# Patient Record
Sex: Female | Born: 1975
Health system: Southern US, Community
[De-identification: ages and names within clinical notes are randomized; demographics above are authoritative.]

## PROBLEM LIST (undated history)

## (undated) DIAGNOSIS — F419 Anxiety disorder, unspecified: Secondary | ICD-10-CM

## (undated) DIAGNOSIS — F319 Bipolar disorder, unspecified: Secondary | ICD-10-CM

## (undated) DIAGNOSIS — F259 Schizoaffective disorder, unspecified: Secondary | ICD-10-CM

## (undated) DIAGNOSIS — F988 Other specified behavioral and emotional disorders with onset usually occurring in childhood and adolescence: Secondary | ICD-10-CM

## (undated) DIAGNOSIS — K589 Irritable bowel syndrome without diarrhea: Secondary | ICD-10-CM

## (undated) HISTORY — DX: Irritable bowel syndrome, unspecified: K58.9

## (undated) HISTORY — DX: Other specified behavioral and emotional disorders with onset usually occurring in childhood and adolescence: F98.8

## (undated) HISTORY — PX: TRACHEOSTOMY: SUR1362

## (undated) HISTORY — PX: CHOLECYSTECTOMY: SHX55

## (undated) HISTORY — DX: Schizoaffective disorder, unspecified: F25.9

## (undated) HISTORY — PX: ENDOMETRIAL ABLATION: SHX621

---

## 1995-05-01 HISTORY — PX: LAPAROSCOPY: SHX197

## 1999-09-23 ENCOUNTER — Inpatient Hospital Stay (HOSPITAL_COMMUNITY): Admission: EM | Admit: 1999-09-23 | Discharge: 1999-09-26 | Payer: Self-pay | Admitting: Psychiatry

## 1999-09-28 ENCOUNTER — Ambulatory Visit (HOSPITAL_COMMUNITY): Admission: RE | Admit: 1999-09-28 | Discharge: 1999-09-28 | Payer: Self-pay | Admitting: Psychiatry

## 1999-10-05 ENCOUNTER — Ambulatory Visit (HOSPITAL_COMMUNITY): Admission: RE | Admit: 1999-10-05 | Discharge: 1999-10-05 | Payer: Self-pay | Admitting: Psychiatry

## 1999-10-12 ENCOUNTER — Ambulatory Visit (HOSPITAL_COMMUNITY): Admission: RE | Admit: 1999-10-12 | Discharge: 1999-10-12 | Payer: Self-pay | Admitting: Psychiatry

## 1999-10-19 ENCOUNTER — Ambulatory Visit (HOSPITAL_COMMUNITY): Admission: RE | Admit: 1999-10-19 | Discharge: 1999-10-19 | Payer: Self-pay | Admitting: Psychiatry

## 1999-10-26 ENCOUNTER — Ambulatory Visit (HOSPITAL_COMMUNITY): Admission: RE | Admit: 1999-10-26 | Discharge: 1999-10-26 | Payer: Self-pay | Admitting: Psychiatry

## 1999-11-09 ENCOUNTER — Ambulatory Visit (HOSPITAL_COMMUNITY): Admission: RE | Admit: 1999-11-09 | Discharge: 1999-11-09 | Payer: Self-pay | Admitting: Psychiatry

## 1999-11-16 ENCOUNTER — Ambulatory Visit (HOSPITAL_COMMUNITY): Admission: RE | Admit: 1999-11-16 | Discharge: 1999-11-16 | Payer: Self-pay | Admitting: Psychiatry

## 1999-11-23 ENCOUNTER — Ambulatory Visit (HOSPITAL_COMMUNITY): Admission: RE | Admit: 1999-11-23 | Discharge: 1999-11-23 | Payer: Self-pay | Admitting: Psychiatry

## 1999-11-30 ENCOUNTER — Ambulatory Visit (HOSPITAL_COMMUNITY): Admission: RE | Admit: 1999-11-30 | Discharge: 1999-11-30 | Payer: Self-pay | Admitting: Psychiatry

## 1999-12-06 ENCOUNTER — Ambulatory Visit (HOSPITAL_COMMUNITY): Admission: RE | Admit: 1999-12-06 | Discharge: 1999-12-06 | Payer: Self-pay | Admitting: Psychiatry

## 1999-12-14 ENCOUNTER — Ambulatory Visit (HOSPITAL_COMMUNITY): Admission: RE | Admit: 1999-12-14 | Discharge: 1999-12-14 | Payer: Self-pay | Admitting: Psychiatry

## 1999-12-21 ENCOUNTER — Ambulatory Visit (HOSPITAL_COMMUNITY): Admission: RE | Admit: 1999-12-21 | Discharge: 1999-12-21 | Payer: Self-pay | Admitting: Psychiatry

## 1999-12-27 ENCOUNTER — Ambulatory Visit (HOSPITAL_COMMUNITY): Admission: RE | Admit: 1999-12-27 | Discharge: 1999-12-27 | Payer: Self-pay | Admitting: Psychiatry

## 2000-01-18 ENCOUNTER — Ambulatory Visit (HOSPITAL_COMMUNITY): Admission: RE | Admit: 2000-01-18 | Discharge: 2000-01-18 | Payer: Self-pay | Admitting: Psychiatry

## 2000-01-25 ENCOUNTER — Ambulatory Visit (HOSPITAL_COMMUNITY): Admission: RE | Admit: 2000-01-25 | Discharge: 2000-01-25 | Payer: Self-pay | Admitting: Psychiatry

## 2000-02-01 ENCOUNTER — Ambulatory Visit (HOSPITAL_COMMUNITY): Admission: RE | Admit: 2000-02-01 | Discharge: 2000-02-01 | Payer: Self-pay | Admitting: Psychiatry

## 2000-02-15 ENCOUNTER — Ambulatory Visit (HOSPITAL_COMMUNITY): Admission: RE | Admit: 2000-02-15 | Discharge: 2000-02-15 | Payer: Self-pay | Admitting: Psychiatry

## 2000-02-27 ENCOUNTER — Ambulatory Visit (HOSPITAL_COMMUNITY): Admission: RE | Admit: 2000-02-27 | Discharge: 2000-02-27 | Payer: Self-pay | Admitting: Psychiatry

## 2000-03-07 ENCOUNTER — Ambulatory Visit (HOSPITAL_COMMUNITY): Admission: RE | Admit: 2000-03-07 | Discharge: 2000-03-07 | Payer: Self-pay | Admitting: Psychiatry

## 2000-03-12 ENCOUNTER — Ambulatory Visit (HOSPITAL_COMMUNITY): Admission: RE | Admit: 2000-03-12 | Discharge: 2000-03-12 | Payer: Self-pay | Admitting: Psychiatry

## 2000-03-19 ENCOUNTER — Ambulatory Visit (HOSPITAL_COMMUNITY): Admission: RE | Admit: 2000-03-19 | Discharge: 2000-03-19 | Payer: Self-pay | Admitting: Psychiatry

## 2000-03-26 ENCOUNTER — Ambulatory Visit (HOSPITAL_COMMUNITY): Admission: RE | Admit: 2000-03-26 | Discharge: 2000-03-26 | Payer: Self-pay | Admitting: Psychiatry

## 2000-04-02 ENCOUNTER — Ambulatory Visit (HOSPITAL_COMMUNITY): Admission: RE | Admit: 2000-04-02 | Discharge: 2000-04-02 | Payer: Self-pay | Admitting: Psychiatry

## 2000-04-08 ENCOUNTER — Ambulatory Visit (HOSPITAL_COMMUNITY): Admission: RE | Admit: 2000-04-08 | Discharge: 2000-04-08 | Payer: Self-pay | Admitting: Psychiatry

## 2000-04-18 ENCOUNTER — Ambulatory Visit (HOSPITAL_COMMUNITY): Admission: RE | Admit: 2000-04-18 | Discharge: 2000-04-18 | Payer: Self-pay | Admitting: Psychiatry

## 2000-05-07 ENCOUNTER — Ambulatory Visit (HOSPITAL_COMMUNITY): Admission: RE | Admit: 2000-05-07 | Discharge: 2000-05-07 | Payer: Self-pay | Admitting: Psychiatry

## 2000-05-14 ENCOUNTER — Ambulatory Visit (HOSPITAL_COMMUNITY): Admission: RE | Admit: 2000-05-14 | Discharge: 2000-05-14 | Payer: Self-pay | Admitting: Psychiatry

## 2000-05-21 ENCOUNTER — Ambulatory Visit (HOSPITAL_COMMUNITY): Admission: RE | Admit: 2000-05-21 | Discharge: 2000-05-21 | Payer: Self-pay | Admitting: Psychiatry

## 2000-05-28 ENCOUNTER — Ambulatory Visit (HOSPITAL_COMMUNITY): Admission: RE | Admit: 2000-05-28 | Discharge: 2000-05-28 | Payer: Self-pay | Admitting: Psychiatry

## 2000-07-19 ENCOUNTER — Inpatient Hospital Stay (HOSPITAL_COMMUNITY): Admission: AD | Admit: 2000-07-19 | Discharge: 2000-07-24 | Payer: Self-pay | Admitting: *Deleted

## 2000-07-21 ENCOUNTER — Emergency Department (HOSPITAL_COMMUNITY): Admission: EM | Admit: 2000-07-21 | Discharge: 2000-07-21 | Payer: Self-pay | Admitting: Emergency Medicine

## 2000-07-25 ENCOUNTER — Other Ambulatory Visit (HOSPITAL_COMMUNITY): Admission: RE | Admit: 2000-07-25 | Discharge: 2000-07-26 | Payer: Self-pay | Admitting: Psychiatry

## 2000-07-28 ENCOUNTER — Inpatient Hospital Stay (HOSPITAL_COMMUNITY): Admission: EM | Admit: 2000-07-28 | Discharge: 2000-07-30 | Payer: Self-pay | Admitting: *Deleted

## 2000-07-31 ENCOUNTER — Other Ambulatory Visit (HOSPITAL_COMMUNITY): Admission: RE | Admit: 2000-07-31 | Discharge: 2000-08-07 | Payer: Self-pay | Admitting: Psychiatry

## 2001-03-06 ENCOUNTER — Ambulatory Visit (HOSPITAL_COMMUNITY): Admission: RE | Admit: 2001-03-06 | Discharge: 2001-03-06 | Payer: Self-pay | Admitting: Pulmonary Disease

## 2001-03-15 ENCOUNTER — Encounter: Payer: Self-pay | Admitting: Emergency Medicine

## 2001-03-15 ENCOUNTER — Emergency Department (HOSPITAL_COMMUNITY): Admission: EM | Admit: 2001-03-15 | Discharge: 2001-03-15 | Payer: Self-pay | Admitting: Emergency Medicine

## 2001-03-20 ENCOUNTER — Encounter (HOSPITAL_COMMUNITY): Admission: RE | Admit: 2001-03-20 | Discharge: 2001-04-19 | Payer: Self-pay | Admitting: *Deleted

## 2001-04-25 ENCOUNTER — Encounter (HOSPITAL_COMMUNITY): Admission: RE | Admit: 2001-04-25 | Discharge: 2001-05-25 | Payer: Self-pay | Admitting: *Deleted

## 2001-11-16 ENCOUNTER — Encounter: Payer: Self-pay | Admitting: Emergency Medicine

## 2001-11-16 ENCOUNTER — Emergency Department (HOSPITAL_COMMUNITY): Admission: EM | Admit: 2001-11-16 | Discharge: 2001-11-16 | Payer: Self-pay | Admitting: Emergency Medicine

## 2002-02-19 ENCOUNTER — Ambulatory Visit (HOSPITAL_COMMUNITY): Admission: RE | Admit: 2002-02-19 | Discharge: 2002-02-19 | Payer: Self-pay | Admitting: *Deleted

## 2002-02-19 ENCOUNTER — Encounter: Payer: Self-pay | Admitting: *Deleted

## 2002-06-03 ENCOUNTER — Observation Stay (HOSPITAL_COMMUNITY): Admission: RE | Admit: 2002-06-03 | Discharge: 2002-06-04 | Payer: Self-pay | Admitting: *Deleted

## 2002-06-10 ENCOUNTER — Observation Stay (HOSPITAL_COMMUNITY): Admission: AD | Admit: 2002-06-10 | Discharge: 2002-06-11 | Payer: Self-pay | Admitting: *Deleted

## 2002-06-12 ENCOUNTER — Ambulatory Visit (HOSPITAL_COMMUNITY): Admission: RE | Admit: 2002-06-12 | Discharge: 2002-06-12 | Payer: Self-pay | Admitting: *Deleted

## 2002-06-24 ENCOUNTER — Observation Stay (HOSPITAL_COMMUNITY): Admission: AD | Admit: 2002-06-24 | Discharge: 2002-06-25 | Payer: Self-pay | Admitting: *Deleted

## 2002-06-29 ENCOUNTER — Inpatient Hospital Stay (HOSPITAL_COMMUNITY): Admission: AD | Admit: 2002-06-29 | Discharge: 2002-07-02 | Payer: Self-pay | Admitting: *Deleted

## 2002-11-20 ENCOUNTER — Encounter: Payer: Self-pay | Admitting: Family Medicine

## 2002-11-20 ENCOUNTER — Ambulatory Visit (HOSPITAL_COMMUNITY): Admission: RE | Admit: 2002-11-20 | Discharge: 2002-11-20 | Payer: Self-pay | Admitting: Family Medicine

## 2003-02-26 ENCOUNTER — Ambulatory Visit (HOSPITAL_COMMUNITY): Admission: RE | Admit: 2003-02-26 | Discharge: 2003-02-26 | Payer: Self-pay | Admitting: Internal Medicine

## 2004-10-27 ENCOUNTER — Ambulatory Visit (HOSPITAL_COMMUNITY): Admission: RE | Admit: 2004-10-27 | Discharge: 2004-10-27 | Payer: Self-pay | Admitting: Family Medicine

## 2005-05-30 ENCOUNTER — Inpatient Hospital Stay (HOSPITAL_COMMUNITY): Admission: RE | Admit: 2005-05-30 | Discharge: 2005-06-11 | Payer: Self-pay | Admitting: Psychiatry

## 2005-05-31 ENCOUNTER — Ambulatory Visit: Payer: Self-pay | Admitting: Psychiatry

## 2005-07-08 ENCOUNTER — Inpatient Hospital Stay (HOSPITAL_COMMUNITY): Admission: EM | Admit: 2005-07-08 | Discharge: 2005-07-12 | Payer: Self-pay | Admitting: Psychiatry

## 2005-07-09 ENCOUNTER — Ambulatory Visit: Payer: Self-pay | Admitting: Psychiatry

## 2005-11-21 ENCOUNTER — Emergency Department (HOSPITAL_COMMUNITY): Admission: EM | Admit: 2005-11-21 | Discharge: 2005-11-21 | Payer: Self-pay | Admitting: Family Medicine

## 2006-04-11 ENCOUNTER — Emergency Department (HOSPITAL_COMMUNITY): Admission: EM | Admit: 2006-04-11 | Discharge: 2006-04-12 | Payer: Self-pay | Admitting: Emergency Medicine

## 2006-09-29 ENCOUNTER — Inpatient Hospital Stay (HOSPITAL_COMMUNITY): Admission: AD | Admit: 2006-09-29 | Discharge: 2006-10-04 | Payer: Self-pay | Admitting: Psychiatry

## 2006-09-29 ENCOUNTER — Ambulatory Visit: Payer: Self-pay | Admitting: Psychiatry

## 2007-01-28 ENCOUNTER — Other Ambulatory Visit: Admission: RE | Admit: 2007-01-28 | Discharge: 2007-01-28 | Payer: Self-pay | Admitting: Obstetrics & Gynecology

## 2007-07-21 ENCOUNTER — Ambulatory Visit (HOSPITAL_COMMUNITY): Admission: RE | Admit: 2007-07-21 | Discharge: 2007-07-21 | Payer: Self-pay | Admitting: Pulmonary Disease

## 2007-07-25 ENCOUNTER — Ambulatory Visit: Payer: Self-pay | Admitting: Physical Medicine & Rehabilitation

## 2007-07-25 ENCOUNTER — Encounter
Admission: RE | Admit: 2007-07-25 | Discharge: 2007-10-23 | Payer: Self-pay | Admitting: Physical Medicine & Rehabilitation

## 2007-07-31 ENCOUNTER — Ambulatory Visit (HOSPITAL_COMMUNITY): Admission: RE | Admit: 2007-07-31 | Discharge: 2007-07-31 | Payer: Self-pay | Admitting: Pulmonary Disease

## 2007-08-28 ENCOUNTER — Ambulatory Visit: Payer: Self-pay | Admitting: Physical Medicine & Rehabilitation

## 2009-08-12 ENCOUNTER — Other Ambulatory Visit: Admission: RE | Admit: 2009-08-12 | Discharge: 2009-08-12 | Payer: Self-pay | Admitting: Obstetrics & Gynecology

## 2010-05-20 ENCOUNTER — Encounter: Payer: Self-pay | Admitting: Internal Medicine

## 2010-07-19 ENCOUNTER — Other Ambulatory Visit (HOSPITAL_COMMUNITY): Payer: Self-pay

## 2010-07-24 ENCOUNTER — Other Ambulatory Visit: Payer: Self-pay | Admitting: Obstetrics & Gynecology

## 2010-07-24 ENCOUNTER — Encounter (HOSPITAL_COMMUNITY): Payer: Medicare Other | Attending: Obstetrics & Gynecology

## 2010-07-24 DIAGNOSIS — Z01812 Encounter for preprocedural laboratory examination: Secondary | ICD-10-CM | POA: Insufficient documentation

## 2010-07-24 LAB — COMPREHENSIVE METABOLIC PANEL
AST: 24 U/L (ref 0–37)
Alkaline Phosphatase: 82 U/L (ref 39–117)
BUN: 6 mg/dL (ref 6–23)
CO2: 26 mEq/L (ref 19–32)
Chloride: 104 mEq/L (ref 96–112)
Creatinine, Ser: 1.07 mg/dL (ref 0.4–1.2)
GFR calc non Af Amer: 58 mL/min — ABNORMAL LOW (ref >60)
Potassium: 3.8 mEq/L (ref 3.5–5.1)
Total Bilirubin: 0.8 mg/dL (ref 0.3–1.2)

## 2010-07-24 LAB — CBC
MCH: 31.6 pg (ref 26.0–34.0)
MCHC: 34.3 g/dL (ref 30.0–36.0)
Platelets: 343 10*3/uL (ref 150–400)
RDW: 12.4 % (ref 11.5–15.5)

## 2010-07-24 LAB — SURGICAL PCR SCREEN: MRSA, PCR: NEGATIVE

## 2010-07-24 LAB — URINALYSIS, ROUTINE W REFLEX MICROSCOPIC
Bilirubin Urine: NEGATIVE
Ketones, ur: NEGATIVE mg/dL
Nitrite: NEGATIVE
Protein, ur: NEGATIVE mg/dL

## 2010-07-26 ENCOUNTER — Ambulatory Visit (HOSPITAL_COMMUNITY)
Admission: RE | Admit: 2010-07-26 | Discharge: 2010-07-26 | Disposition: A | Discharge status: Home / Self Care | Payer: Medicare Other | Source: Ambulatory Visit | Attending: Obstetrics & Gynecology | Admitting: Obstetrics & Gynecology

## 2010-07-26 ENCOUNTER — Other Ambulatory Visit: Payer: Self-pay | Admitting: Obstetrics & Gynecology

## 2010-07-26 DIAGNOSIS — N946 Dysmenorrhea, unspecified: Secondary | ICD-10-CM | POA: Insufficient documentation

## 2010-07-26 DIAGNOSIS — N920 Excessive and frequent menstruation with regular cycle: Secondary | ICD-10-CM | POA: Insufficient documentation

## 2010-08-06 NOTE — Op Note (Signed)
  NAME:  Sara Vance, Sara Vance NO.:  [id number]  MEDICAL RECORD NO.:  [id number]           PATIENT TYPE:  O  LOCATION:  DAYP                          FACILITY:  [facility]  PHYSICIAN:  Lazaro Arms, M.D.   DATE OF BIRTH:  11/05/1975  DATE OF PROCEDURE:  07/26/2010 DATE OF DISCHARGE:                              OPERATIVE REPORT   PREOPERATIVE DIAGNOSES: 1. Menometrorrhagia. 2. Dysmenorrhea.  POSTOPERATIVE DIAGNOSES: 1. Menometrorrhagia. 2. Dysmenorrhea.  PROCEDURE:  Hysteroscopy D and C, endometrial ablation.  SURGEON:  Lazaro Arms, MD  ANESTHESIA:  General endotracheal.  FINDINGS:  The patient had a large amount of tissue, but there were no polyps, fibroids, or other abnormalities.  DESCRIPTION OF OPERATION:  The patient taken to the operating room, placed in supine position where she underwent general endotracheal anesthesia, placed in low lithotomy position, prepped and draped in usual sterile fashion.  A Graves speculum was placed.  The cervix was grasped with single-tooth tenaculum, dilated serially to allow passage of the hysteroscope.  A diagnostic hysteroscopy was performed and the uterus was found to have a large amount of endometrial tissue, but no polyps, no fibroids, no other abnormalities.  A thorough endometrial curettage was then performed and a large amount of tissue was returned and sent to Pathology for evaluation.  A ThermaChoice III endometrial ablation balloon was then used, 75 mL of D5W was required to maintain a pressure between 190 and 200 mmHg throughout the procedure.  Total of therapy time was 19 minutes and 41 seconds.  All of the equipment worked properly throughout the procedure.  All the fluid was returned at the end of the procedure. The patient tolerated procedure well.  She experienced minimal blood loss.  She did have a cervical laceration which was closed with 0 Vicryl figure-of-eight fashion without difficulty.  She was  awakened from anesthesia, taken to recovery room in stable condition.  All counts were correct x3.  She received Toradol and Ancef preoperatively.     Lazaro Arms, M.D.     Loraine Maple  D:  07/26/2010  T:  07/27/2010  Job:  [id number]  Electronically Signed by Duane Lope M.D. on 08/06/2010 02:41:52 PM
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
## 2010-09-15 NOTE — Op Note (Signed)
NAME:  Sara Vance, Sara Vance NO.:  [id number]   MEDICAL RECORD NO.:  [id number]                   PATIENT TYPE:  INP   LOCATION:  LDR4                                 FACILITY:  [facility]   PHYSICIAN:  Langley Gauss, M.D.                DATE OF BIRTH:  05/21/1975   DATE OF PROCEDURE:  06/30/2002  DATE OF DISCHARGE:                                 OPERATIVE REPORT   This is a delivery note.   PREOPERATIVE DIAGNOSIS:  1. A 37-week intrauterine pregnancy.  2. Gestational hypertension and edema.  3. Meconium stained amniotic fluid.  4. Nuchal cord times one without compression.   DELIVERY PERFORMED:  1. Low vacuum extraction 6 pound 11 ounce female infant.  2. Midline episiotomy repair.   ANALGESIA:  Continuous lumbar epidural.  This was supplemented with 30 ml of  1% lidocaine in the midline of the perineal body at time of delivery.   INDICATION FOR VACUUM EXTRACTION:  Maternal exhaustion with failure to  descend beyond the +2 station.   DESCRIPTION OF PROCEDURE:  The patient had been admitted for induction of  labor with findings of elevated blood pressures 130/100.  These had a  tendency to normalize at bedrest.  She was followed very closely and was  also noted to have an 8 pound weight gain over two weeks' time with  amniotomy noted to be nonparticulate, thin green meconium stained amniotic  fluid.  Epidural was placed without difficulty.  This functioned very well  throughout the remainder of the labor course.  The patient did reach  complete dilatation at 0430 and was given excellent coaching by the nursing  staff, pushing during the second stage.  She was able to push with the  vertex to a +2 station in order to be in an LOA position at which time she  became very frustrated and somewhat exhausted with her pushing effort and  she did specifically request vacuum assisted delivery.  The Foley catheter  was removed.  The patient was placed in a dorsal  lithotomy position, prepped  and draped in the usual sterile manner.  Kiwi vacuum extractor was utilized.  At all times, the pressure was kept within the safe green range and at all  times suction was released in between uterine contractions and then  reapplied with the onset of uterine contraction.  The Kiwi did not pop off  at any time, rather was removed in between contractions.  The patient was  examined and noted to be at +2 station with the LOA position.  Kiwi vacuum  extraction was then placed with gentle traction over the subsequent next  four contractions with excellent pushing efforts.  There was easily descent  to the perineal floor at which time a small midline episiotomy was  performed.  The infant was noted to deliver in a direct OA position over  this midline episiotomy without  extension.  A nuchal cord times one was  reduced.  Spontaneous rotation occurred to her right anterior shoulder  position.  Gentle downward traction combined with maternal expulsive efforts  resulted in delivery of the extensor shoulder beneath the pubic symphysis  without difficulty.  The remainder of the infant also delivered without  difficulty.  The umbilical cord was then milked towards the infant.  The  cord was doubly clamped and cut and the infant handed to the nursing staff  for their immediate assessment.  Arterial cord gas and cord blood then  obtained.  Gentle traction on the umbilical cord resulted in separation  which upon examination appears to be intact three-vessel placenta with  attached cord.  Examination of genital tract reveals no lacerations.  The  midline episiotomy was not extended.  This was easily repaired utilizing 0  chromic in a running locked fashion on the vaginal mucosa, a running layer  of 2-0 Vicryl in the deep tissues of the perineal body, specifically the  superficial transverse perineal muscle, the skin on the perineum was  reapproximated utilizing a continuous  running 0 chromic suture.  Complications of the delivery include a postpartum hemorrhage secondary to  uterine atony.  The vascular access was lost at time of delivery, thus it  was not possible to give intravenous Pitocin bolus.  Rather the patient was  administered 10 units of subcu Pitocin as well as one ampule of Hemabate.  Following delivery, the patient was taken out of dorsal lithotomy position,  rolled to her side at which time the epidural catheter was removed with the  blue tip noted to be intact.  Notably at time of delivery, the nursing staff  had been informed of the meconium stained amniotic fluid and I had requested  DeLee be connected to wall suction.  At time of delivery it became apparent  that this had not been done as the nursing staff at this time stated that  the suction was not functional.  Thus I was unable to do any DeLee  suctioning on the perineum.  Rather, bulb suctioning was performed.  Fortunately, the meconium was noted to be very thin and nonparticulate.                                                Langley Gauss, M.D.    DC/MEDQ  D:  06/30/2002  T:  07/01/2002  Job:  [id number]
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
## 2010-09-22 ENCOUNTER — Other Ambulatory Visit: Payer: Self-pay | Admitting: Obstetrics & Gynecology

## 2010-09-22 ENCOUNTER — Other Ambulatory Visit (HOSPITAL_COMMUNITY)
Admission: RE | Admit: 2010-09-22 | Discharge: 2010-09-22 | Disposition: A | Discharge status: Home / Self Care | Payer: Medicare Other | Source: Ambulatory Visit | Attending: Obstetrics & Gynecology | Admitting: Obstetrics & Gynecology

## 2010-09-22 DIAGNOSIS — Z01419 Encounter for gynecological examination (general) (routine) without abnormal findings: Secondary | ICD-10-CM | POA: Insufficient documentation

## 2011-02-01 ENCOUNTER — Emergency Department (HOSPITAL_COMMUNITY)
Admission: EM | Admit: 2011-02-01 | Discharge: 2011-02-02 | Disposition: A | Discharge status: Other Acute Inpatient Hospital | Payer: Medicare Other | Attending: Emergency Medicine | Admitting: Emergency Medicine

## 2011-02-01 DIAGNOSIS — F3289 Other specified depressive episodes: Secondary | ICD-10-CM | POA: Insufficient documentation

## 2011-02-01 DIAGNOSIS — R45851 Suicidal ideations: Secondary | ICD-10-CM | POA: Insufficient documentation

## 2011-02-01 DIAGNOSIS — F329 Major depressive disorder, single episode, unspecified: Secondary | ICD-10-CM | POA: Insufficient documentation

## 2011-02-01 DIAGNOSIS — R4181 Age-related cognitive decline: Secondary | ICD-10-CM | POA: Insufficient documentation

## 2011-02-01 DIAGNOSIS — Z79899 Other long term (current) drug therapy: Secondary | ICD-10-CM | POA: Insufficient documentation

## 2011-02-01 LAB — RAPID URINE DRUG SCREEN, HOSP PERFORMED
Amphetamines: POSITIVE — AB
Benzodiazepines: NOT DETECTED
Cocaine: NOT DETECTED
Opiates: NOT DETECTED

## 2011-02-01 LAB — DIFFERENTIAL
Basophils Absolute: 0 10*3/uL (ref 0.0–0.1)
Eosinophils Relative: 2 % (ref 0–5)
Lymphocytes Relative: 31 % (ref 12–46)
Monocytes Absolute: 0.6 10*3/uL (ref 0.1–1.0)
Monocytes Relative: 8 % (ref 3–12)
Neutro Abs: 4.5 10*3/uL (ref 1.7–7.7)

## 2011-02-01 LAB — COMPREHENSIVE METABOLIC PANEL
Albumin: 4 g/dL (ref 3.5–5.2)
BUN: 7 mg/dL (ref 6–23)
Calcium: 10.1 mg/dL (ref 8.4–10.5)
Creatinine, Ser: 0.91 mg/dL (ref 0.50–1.10)
GFR calc Af Amer: >90 mL/min (ref >90)
Glucose, Bld: 83 mg/dL (ref 70–99)
Potassium: 3.8 mEq/L (ref 3.5–5.1)
Total Protein: 7.4 g/dL (ref 6.0–8.3)

## 2011-02-01 LAB — CBC
HCT: 40.9 % (ref 36.0–46.0)
Hemoglobin: 14 g/dL (ref 12.0–15.0)
MCH: 31 pg (ref 26.0–34.0)
MCHC: 34.2 g/dL (ref 30.0–36.0)
MCV: 90.5 fL (ref 78.0–100.0)
RDW: 12.4 % (ref 11.5–15.5)

## 2011-02-01 LAB — ETHANOL: Alcohol, Ethyl (B): <11 mg/dL (ref 0–11)

## 2011-02-02 ENCOUNTER — Inpatient Hospital Stay (HOSPITAL_COMMUNITY)
Admission: RE | Admit: 2011-02-02 | Discharge: 2011-02-07 | DRG: 885 | Disposition: A | Discharge status: Home / Self Care | Payer: No Typology Code available for payment source | Source: Ambulatory Visit | Attending: Psychiatry | Admitting: Psychiatry

## 2011-02-02 DIAGNOSIS — F339 Major depressive disorder, recurrent, unspecified: Secondary | ICD-10-CM

## 2011-02-02 DIAGNOSIS — N393 Stress incontinence (female) (male): Secondary | ICD-10-CM

## 2011-02-02 DIAGNOSIS — Z6379 Other stressful life events affecting family and household: Secondary | ICD-10-CM

## 2011-02-02 DIAGNOSIS — F332 Major depressive disorder, recurrent severe without psychotic features: Principal | ICD-10-CM

## 2011-02-02 DIAGNOSIS — Z79899 Other long term (current) drug therapy: Secondary | ICD-10-CM

## 2011-02-02 DIAGNOSIS — F411 Generalized anxiety disorder: Secondary | ICD-10-CM

## 2011-02-02 DIAGNOSIS — R45851 Suicidal ideations: Secondary | ICD-10-CM

## 2011-02-03 LAB — PREGNANCY, URINE: Preg Test, Ur: NEGATIVE
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
## 2011-02-07 LAB — VITAMIN D 1,25 DIHYDROXY
Vitamin D2 1, 25 (OH)2: <8 pg/mL
Vitamin D3 1, 25 (OH)2: 42 pg/mL

## 2011-02-09 NOTE — Discharge Summary (Signed)
NAME:  Sara Vance, KASS NO.:  [id number]  MEDICAL RECORD NO.:  [id number]  LOCATION:  0503                          [facility]ACILITY:  [facility]  PHYSICIAN:  Franchot Gallo, MD     DATE OF BIRTH:  04-29-1976  DATE OF ADMISSION:  02/02/2011 DATE OF DISCHARGE:  02/07/2011                              DISCHARGE SUMMARY   REASON FOR ADMISSION:  This is a 35 year old female that presented with a history of depressive symptoms, having suicidal thoughts with a plan to slit her wrists and enter some water.  She recently stopped her Abilify as she had reported a weight gain of over 100 pounds although she did lose 40 pounds over the last 3 months.  She denied any psychotic symptoms.  FINAL IMPRESSION: 1. Major depressive disorder, recurrent, moderate to severe. 2. Generalized anxiety disorder. AXIS II:  Deferred. AXIS III:  History of stress incontinence. AXIS IV:  Other psychosocial problems rated to chronic mental illness. AXIS V:  Global Assessment of Functioning at discharge is 65.  PERTINENT LABS:  CBC within normal limits.  Alcohol level less than 11. Urine drug screen is positive for amphetamines.  SIGNIFICANT FINDINGS:  Patient was admitted to the adult milieu for safety and stabilization.  We resumed her medications at this time.  A case manager had contact with patient's mother to address any safety issues and for [place] to provide information.  Patient was active in attending group.  Although she continued suicidal ideation, she continued to promise safety on the unit.  She was reporting good sleep, however, her appetite was low and stated that she would plan to cut her wrist if she were not hospitalized.  She denied any hallucinations. Patient was beginning to improve.  She rated her hopelessness a 5 on a scale of 1 to 10, sleeping better but still having some problems with appetite, having no side effects to her medications.  We added Lamictal to lessen her depressive  symptoms, resumed her Ditropan for her urinary incontinence, but her depression and her hopelessness were beginning to improve rating it a 4 to 5 on a scale of 1 to 10.  We also increased her Neurontin to 800 mg t.i.d. to help with anxiety and ordered her some Diflucan for a history of a yeast infection.  On day of discharge, patient was seen in the interdisciplinary treatment team.  She reported good sleep, good appetite, mild depressive symptoms, rating it a 3 on a scale of 1 to 10, adamantly denying any suicidal or homicidal thoughts or auditory or visual hallucinations, rating her anxiety a 3 on a scale of 1 to 10, having no medication side effects.  DISCHARGE MEDICATIONS:  Included: 1. Gabapentin 800 mg one t.i.d. 2. Lamictal 25 mg one q.h.s. 3. Wellbutrin 150 mg taking 3 tablets daily. 4. Adderall 20 mg one daily. 5. Multivitamin one daily. 6. Ditropan 5 mg one b.i.d. 7. Prozac 40 mg daily. 8. Trazodone 150 mg q.h.s. 9. Vitamin B one daily.  FOLLOWUP APPOINTMENT:  With Starling Manns at [facility], phone number [phone], on Tuesday, February 13, 2011, at 10:40.  She also had appointment with Abel Presto, a therapist at [facility], phone number [phone], on Monday, February 12, 2011, at 2 p.m.     Landry Corporal, N.P.   ______________________________ Franchot Gallo, MD    JO/MEDQ  D:  02/08/2011  T:  02/09/2011  Job:  [id number]  Electronically Signed by Limmie PatriciaP. on 02/09/2011 09:38:40 AM Electronically Signed by Franchot Gallo MD on 02/09/2011 04:59:29 PM

## 2011-02-11 ENCOUNTER — Encounter: Payer: Self-pay | Admitting: *Deleted

## 2011-02-11 ENCOUNTER — Emergency Department (HOSPITAL_COMMUNITY)
Admission: EM | Admit: 2011-02-11 | Discharge: 2011-02-11 | Disposition: A | Discharge status: Home / Self Care | Payer: Medicare Other | Attending: Emergency Medicine | Admitting: Emergency Medicine

## 2011-02-11 DIAGNOSIS — F319 Bipolar disorder, unspecified: Secondary | ICD-10-CM | POA: Insufficient documentation

## 2011-02-11 DIAGNOSIS — F411 Generalized anxiety disorder: Secondary | ICD-10-CM | POA: Insufficient documentation

## 2011-02-11 DIAGNOSIS — N72 Inflammatory disease of cervix uteri: Secondary | ICD-10-CM | POA: Insufficient documentation

## 2011-02-11 DIAGNOSIS — N938 Other specified abnormal uterine and vaginal bleeding: Secondary | ICD-10-CM | POA: Insufficient documentation

## 2011-02-11 DIAGNOSIS — N949 Unspecified condition associated with female genital organs and menstrual cycle: Secondary | ICD-10-CM | POA: Insufficient documentation

## 2011-02-11 HISTORY — DX: Bipolar disorder, unspecified: F31.9

## 2011-02-11 HISTORY — DX: Anxiety disorder, unspecified: F41.9

## 2011-02-11 LAB — POCT PREGNANCY, URINE: Preg Test, Ur: NEGATIVE

## 2011-02-11 LAB — WET PREP, GENITAL: Yeast Wet Prep HPF POC: NONE SEEN

## 2011-02-11 LAB — URINALYSIS, ROUTINE W REFLEX MICROSCOPIC
Bilirubin Urine: NEGATIVE
Leukocytes, UA: NEGATIVE
Nitrite: NEGATIVE
Specific Gravity, Urine: 1.02 (ref 1.005–1.030)
Urobilinogen, UA: 0.2 mg/dL (ref 0.0–1.0)
pH: 6.5 (ref 5.0–8.0)

## 2011-02-11 MED ORDER — AZITHROMYCIN 250 MG PO TABS
1000.0000 mg | ORAL_TABLET | Freq: Once | ORAL | Status: AC
  Administered 2011-02-11: 1000 mg via ORAL
  Filled 2011-02-11: qty 4

## 2011-02-11 MED ORDER — CEFTRIAXONE SODIUM 250 MG IJ SOLR
250.0000 mg | Freq: Once | INTRAMUSCULAR | Status: AC
  Administered 2011-02-11: 250 mg via INTRAMUSCULAR
  Filled 2011-02-11: qty 250

## 2011-02-11 MED ORDER — FLUCONAZOLE 100 MG PO TABS
150.0000 mg | ORAL_TABLET | Freq: Once | ORAL | Status: AC
  Administered 2011-02-11: 150 mg via ORAL
  Filled 2011-02-11: qty 2

## 2011-02-11 NOTE — ED Notes (Signed)
Pt a/ox4. Resp even and unlabored. D/C instructions reviewed with pt. Pt verbalized understanding. Pt ambulated to POV with steady gate. 

## 2011-02-11 NOTE — ED Notes (Signed)
Pelvic setup at bedside.

## 2011-02-11 NOTE — ED Provider Notes (Signed)
History     CSN: [id number] Arrival date & time: 02/11/2011 12:53 PM  Chief Complaint  Patient presents with  . Vaginal Discharge    (Consider location/radiation/quality/duration/timing/severity/associated sxs/prior treatment) HPI Patient presents with complaint of vaginal spotting and yellowish discharge. States the symptoms have been going on proximally one week. She was also treated for a yeast infection with Diflucan by mouth times one approximately one week ago and states discharge has not improved. She also feels like there is irritation on her external genitalia. No history of herpes. Patient has history of endometrial ablation for menorrhagia approximately 6 months ago and occasionally has spotting but this is not her usual pattern. Mild lower abdominal pain. No fevers no vomiting. No other associated symptoms.  Past Medical History  Diagnosis Date  . Bipolar 1 disorder   . Anxiety     Past Surgical History  Procedure Date  . Endometrial ablation     No family history on file.  History  Substance Use Topics  . Smoking status: Never Smoker   . Smokeless tobacco: Not on file  . Alcohol Use: No    OB History    Grav Para Term Preterm Abortions TAB SAB Ect Mult Living                  Review of Systems ROS reviewed and otherwise negative except for mentioned in HPI  Allergies  Review of patient's allergies indicates no known allergies.  Home Medications   Current Outpatient Rx  Name Route Sig Dispense Refill  . ACETAMINOPHEN 500 MG PO TABS Oral Take 500 mg by mouth every 6 (six) hours as needed. For headache      . AMPHETAMINE-DEXTROAMPHETAMINE 20 MG PO TABS Oral Take 20 mg by mouth daily.      . B COMPLEX-C PO TABS Oral Take 1 tablet by mouth daily.      . BUPROPION HCL ER (XL) 150 MG PO TB24 Oral Take 450 mg by mouth daily.      Marland Kitchen FLUOXETINE HCL 40 MG PO CAPS Oral Take 40 mg by mouth daily.      Marland Kitchen GABAPENTIN 800 MG PO TABS Oral Take 800 mg by mouth 3  (three) times daily.      . IBUPROFEN 200 MG PO TABS Oral Take 200 mg by mouth every 6 (six) hours as needed. For pain     . LAMOTRIGINE 25 MG PO TABS Oral Take 25 mg by mouth at bedtime.      Carma Leaven M PLUS PO TABS Oral Take 1 tablet by mouth at bedtime.      . OXYBUTYNIN CHLORIDE 5 MG PO TABS Oral Take 5 mg by mouth 2 (two) times daily.      . TRAZODONE HCL 150 MG PO TABS Oral Take 150 mg by mouth at bedtime.        BP 126/69  Pulse 79  Temp(Src) 98.3 F (36.8 C) (Oral)  Resp 20  Ht 5\' 4"  (1.626 m)  Wt 220 lb (99.791 kg)  BMI 37.76 kg/m2  SpO2 98% Vitals reviewed  Physical Exam Physical Examination: General appearance - alert, well appearing, and in no distress Mental status - alert, oriented to person, place, and time Chest - clear to auscultation, no wheezes, rales or rhonchi, symmetric air entry Heart - normal rate, regular rhythm, normal S1, S2, no murmurs, rubs, clicks or gallops Abdomen - soft, nontender, nondistended, no masses or organomegaly Pelvic - diffuse erythema and irritation of labia minora  and majora, no vesicles/abscess/other lesions, scant red blood in vaginal vault, no significant discharge, no CMT, no adnexal tenderness Musculoskeletal - no joint tenderness, deformity or swelling Extremities - peripheral pulses normal, no pedal edema, no clubbing or cyanosis   ED Course  Procedures (including critical care time) 1:56 PM Pelvic exam performed, scant vaginal bleeding, general perineal redness and inflammation, no discrete ulcerations or lesions.  Awaiting urine and wet prep.  No CMT or adnexal tenderness.   3:36 PM Urine unremarkable, few WBCs on wet prep, GC pending.   Labs Reviewed  URINALYSIS, ROUTINE W REFLEX MICROSCOPIC  POCT PREGNANCY, URINE  GC/CHLAMYDIA PROBE AMP, GENITAL  WET PREP, GENITAL  POCT PREGNANCY, URINE   No results found.   No diagnosis found.    MDM  Plan to treat for cervicitis, with rocephin and zithromax, will also repeat  diflucan as perineal irritation may be due to yeast.  Pt discharged with strict return precautions, she is agreeable with this plan and will f/u with her gynecologist.  Abdomen is not significantly tender so I will not do pelvic ultrasound, but she will likely need this as an outpatient- this was discussed with patient, who is agreeable        Ethelda Chick, MD 02/11/11 [phone]

## 2011-02-11 NOTE — ED Notes (Signed)
Pt states she has yellow and blood-tinged discharge from her vagina and she feels like there are sores on the outside of her vagina. First noticed symptoms last week while being treated at [facility]. Pt also states lower abdominal pain.

## 2011-02-12 LAB — GC/CHLAMYDIA PROBE AMP, GENITAL
Chlamydia, DNA Probe: NEGATIVE
GC Probe Amp, Genital: NEGATIVE

## 2011-02-15 LAB — DIFFERENTIAL
Basophils Absolute: 0
Basophils Relative: 1
Monocytes Absolute: 0.5
Neutro Abs: 3
Neutrophils Relative %: 45

## 2011-02-15 LAB — BASIC METABOLIC PANEL
CO2: 33 — ABNORMAL HIGH
Calcium: 8.9
GFR calc Af Amer: >60
Sodium: 142

## 2011-02-15 LAB — CBC
MCHC: 33.8
Platelets: 370
RDW: 12.4

## 2011-02-15 LAB — HEPATIC FUNCTION PANEL
Albumin: 3.8
Total Bilirubin: 0.7
Total Protein: 6.4

## 2011-11-06 ENCOUNTER — Other Ambulatory Visit (HOSPITAL_COMMUNITY)
Admission: RE | Admit: 2011-11-06 | Discharge: 2011-11-06 | Disposition: A | Discharge status: Home / Self Care | Payer: Medicare Other | Source: Ambulatory Visit | Attending: Obstetrics & Gynecology | Admitting: Obstetrics & Gynecology

## 2011-11-06 ENCOUNTER — Other Ambulatory Visit: Payer: Self-pay | Admitting: Obstetrics & Gynecology

## 2011-11-06 DIAGNOSIS — Z124 Encounter for screening for malignant neoplasm of cervix: Secondary | ICD-10-CM | POA: Insufficient documentation

## 2012-05-02 ENCOUNTER — Emergency Department (HOSPITAL_COMMUNITY): Payer: Medicare Other

## 2012-05-02 ENCOUNTER — Encounter (HOSPITAL_COMMUNITY): Payer: Self-pay | Admitting: Emergency Medicine

## 2012-05-02 ENCOUNTER — Emergency Department (HOSPITAL_COMMUNITY)
Admission: EM | Admit: 2012-05-02 | Discharge: 2012-05-02 | Disposition: A | Discharge status: Home / Self Care | Payer: Medicare Other | Attending: Emergency Medicine | Admitting: Emergency Medicine

## 2012-05-02 DIAGNOSIS — Y929 Unspecified place or not applicable: Secondary | ICD-10-CM | POA: Insufficient documentation

## 2012-05-02 DIAGNOSIS — Z3202 Encounter for pregnancy test, result negative: Secondary | ICD-10-CM | POA: Insufficient documentation

## 2012-05-02 DIAGNOSIS — F319 Bipolar disorder, unspecified: Secondary | ICD-10-CM | POA: Insufficient documentation

## 2012-05-02 DIAGNOSIS — S82409A Unspecified fracture of shaft of unspecified fibula, initial encounter for closed fracture: Secondary | ICD-10-CM

## 2012-05-02 DIAGNOSIS — Z79899 Other long term (current) drug therapy: Secondary | ICD-10-CM | POA: Insufficient documentation

## 2012-05-02 DIAGNOSIS — Y939 Activity, unspecified: Secondary | ICD-10-CM | POA: Insufficient documentation

## 2012-05-02 DIAGNOSIS — R42 Dizziness and giddiness: Secondary | ICD-10-CM | POA: Insufficient documentation

## 2012-05-02 DIAGNOSIS — F411 Generalized anxiety disorder: Secondary | ICD-10-CM | POA: Insufficient documentation

## 2012-05-02 DIAGNOSIS — W010XXA Fall on same level from slipping, tripping and stumbling without subsequent striking against object, initial encounter: Secondary | ICD-10-CM | POA: Insufficient documentation

## 2012-05-02 DIAGNOSIS — S82899A Other fracture of unspecified lower leg, initial encounter for closed fracture: Secondary | ICD-10-CM | POA: Insufficient documentation

## 2012-05-02 LAB — BASIC METABOLIC PANEL
BUN: 8 mg/dL (ref 6–23)
CO2: 30 mEq/L (ref 19–32)
Chloride: 100 mEq/L (ref 96–112)
Glucose, Bld: 98 mg/dL (ref 70–99)
Potassium: 3.5 mEq/L (ref 3.5–5.1)

## 2012-05-02 LAB — CBC WITH DIFFERENTIAL/PLATELET
Eosinophils Absolute: 0.1 10*3/uL (ref 0.0–0.7)
Hemoglobin: 13.7 g/dL (ref 12.0–15.0)
Lymphocytes Relative: 25 % (ref 12–46)
Lymphs Abs: 1.6 10*3/uL (ref 0.7–4.0)
MCH: 31.2 pg (ref 26.0–34.0)
Monocytes Relative: 12 % (ref 3–12)
Neutro Abs: 4.1 10*3/uL (ref 1.7–7.7)
Neutrophils Relative %: 62 % (ref 43–77)
RBC: 4.39 MIL/uL (ref 3.87–5.11)

## 2012-05-02 LAB — URINALYSIS, ROUTINE W REFLEX MICROSCOPIC
Bilirubin Urine: NEGATIVE
Glucose, UA: NEGATIVE mg/dL
Hgb urine dipstick: NEGATIVE
Specific Gravity, Urine: 1.025 (ref 1.005–1.030)
pH: 6 (ref 5.0–8.0)

## 2012-05-02 LAB — POCT PREGNANCY, URINE: Preg Test, Ur: NEGATIVE

## 2012-05-02 LAB — URINE MICROSCOPIC-ADD ON

## 2012-05-02 NOTE — ED Notes (Signed)
Pt reports has had cold for the past week with nonproductive cough.  Denies fever.  Reports was very dizzy yesterday morning when she woke up.  Says she got out of bed quickly, became dizzy, and fell hurting left ankle.  Swelling noted to left ankle.  Capillary refill wnl, pedal pulse present, pt can wiggle toes.  Pt has been ambulating with her mother's cane.  Reports woke up this am dizzy again.  States sat down on bed and fell off bed onto floor.  C/O pain and bruising to R cheek.  Pt reports started taking "perphenizine"  Several months ago and was dizzy after taking it.  Reports increased her dosage 2 months ago.

## 2012-05-02 NOTE — ED Notes (Signed)
Discharge instructions reviewed with pt, questions answered. Pt verbalized understanding.  

## 2012-05-02 NOTE — ED Notes (Signed)
Pt states has had increased dizziness since starting on new medication several months ago. Pt states fell yesterday and hurt left ankle. Today she fell hurting her left cheek.

## 2012-05-02 NOTE — ED Provider Notes (Signed)
History     CSN: [id number]  Arrival date & time 05/02/12  1136   First MD Initiated Contact with Patient 05/02/12 1253      Chief Complaint  Patient presents with  . Fall    fell twice today.  . Ankle Pain  . Dizziness    (Consider location/radiation/quality/duration/timing/severity/associated sxs/prior treatment) HPI Patient with increased trilafon dosage for bipad.  She became lightheaded and fell yesterday with ongoing left ankle pain.  She feels lightheaded with standing and has not born weight on left ankle since fall.  She states she went to bed and came in today since ankle still hurt.  She denies striking her head or loc.  She denies other injury.   Past Medical History  Diagnosis Date  . Bipolar 1 disorder   . Anxiety     Past Surgical History  Procedure Date  . Endometrial ablation   . Cholecystectomy     History reviewed. No pertinent family history.  History  Substance Use Topics  . Smoking status: Never Smoker   . Smokeless tobacco: Not on file  . Alcohol Use: No    OB History    Grav Para Term Preterm Abortions TAB SAB Ect Mult Living                  Review of Systems  All other systems reviewed and are negative.    Allergies  Latex  Home Medications   Current Outpatient Rx  Name  Route  Sig  Dispense  Refill  . ACETAMINOPHEN 500 MG PO TABS   Oral   Take 500 mg by mouth every 6 (six) hours as needed. For headache         . AMPHETAMINE-DEXTROAMPHETAMINE 20 MG PO TABS   Oral   Take 20 mg by mouth daily.           . BUPROPION HCL ER (XL) 150 MG PO TB24   Oral   Take 450 mg by mouth daily.           Marland Kitchen CLONAZEPAM 1 MG PO TABS   Oral   Take 1 mg by mouth at bedtime.         Marland Kitchen GABAPENTIN 800 MG PO TABS   Oral   Take 800 mg by mouth 2 (two) times daily.          . IBUPROFEN 200 MG PO TABS   Oral   Take 400 mg by mouth every 6 (six) hours as needed. For pain         . OXYBUTYNIN CHLORIDE 5 MG PO TABS   Oral   Take  5 mg by mouth 2 (two) times daily.           Marland Kitchen PERPHENAZINE 16 MG PO TABS   Oral   Take 22 mg by mouth daily.         . TRAZODONE HCL 150 MG PO TABS   Oral   Take 150 mg by mouth at bedtime.             BP 98/58  Pulse 109  Temp 97.7 F (36.5 C) (Oral)  Resp 20  Ht 5\' 4"  (1.626 m)  Wt 180 lb (81.647 kg)  BMI 30.90 kg/m2  SpO2 100%  Physical Exam  Nursing note and vitals reviewed. Constitutional: She appears well-developed and well-nourished.  HENT:  Head: Normocephalic and atraumatic.  Eyes: Conjunctivae normal and EOM are normal. Pupils are equal, round, and reactive to light.  Neck: Normal range of motion. Neck supple.  Cardiovascular: Normal rate, regular rhythm, normal heart sounds and intact distal pulses.   Pulmonary/Chest: Effort normal and breath sounds normal.  Abdominal: Soft. Bowel sounds are normal.  Musculoskeletal: Normal range of motion.       Left ankle tender with swelling noted,no medial or posterior tenderness.  Neurovascularly intact distal to injury, full arom knee and hip.   Neurological: She is alert.  Skin: Skin is warm and dry.  Psychiatric: She has a normal mood and affect. Thought content normal.    ED Course  Procedures (including critical care time)  Labs Reviewed  CBC WITH DIFFERENTIAL - Abnormal; Notable for the following:    RDW 11.2 (*)     All other components within normal limits  BASIC METABOLIC PANEL - Abnormal; Notable for the following:    GFR calc non Af Amer 77 (*)     GFR calc Af Amer 89 (*)     All other components within normal limits  URINALYSIS, ROUTINE W REFLEX MICROSCOPIC - Abnormal; Notable for the following:    APPearance CLOUDY (*)     Protein, ur TRACE (*)     All other components within normal limits  URINE MICROSCOPIC-ADD ON - Abnormal; Notable for the following:    Squamous Epithelial / LPF FEW (*)     All other components within normal limits  POCT PREGNANCY, URINE  RAPID STREP SCREEN   Dg Ankle  Complete Left  05/02/2012  *RADIOLOGY REPORT*  Clinical Data: Left ankle pain following fall  LEFT ANKLE COMPLETE - 3+ VIEW  Comparison: None.  Findings: There is a mildly displaced transverse fracture through the distal aspect of the left fibula.  Mild soft tissue swelling is noted.  No other fracture is seen.  IMPRESSION: Distal left fibular fracture.   Original Report Authenticated By: Alcide Clever, M.D.      No diagnosis found.    MDM  Patient splinted and given crutches.  She is referred to ortho for follow up.  Patient advised to decrease dose of trilafon and recheck with her [facility] provider next week.         Hilario Quarry, MD 05/09/12 [phone]

## 2012-05-07 ENCOUNTER — Ambulatory Visit (INDEPENDENT_AMBULATORY_CARE_PROVIDER_SITE_OTHER): Payer: Medicare Other | Admitting: Orthopedic Surgery

## 2012-05-07 ENCOUNTER — Encounter: Payer: Self-pay | Admitting: Orthopedic Surgery

## 2012-05-07 VITALS — BP 102/60 | Ht 64.0 in | Wt 180.0 lb

## 2012-05-07 DIAGNOSIS — S8263XA Displaced fracture of lateral malleolus of unspecified fibula, initial encounter for closed fracture: Secondary | ICD-10-CM | POA: Insufficient documentation

## 2012-05-07 NOTE — Patient Instructions (Signed)
Weight bearing as tolerated with walker   Remove brace for bathing

## 2012-05-07 NOTE — Progress Notes (Signed)
Patient ID: Sara Vance, female   DOB: 12-29-1975, 37 y.o.   MRN: [id number] Chief Complaint  Patient presents with  . Ankle Pain    Left ankle fracture d/t injury 05/01/12    Date of injury January 2 The patient became dizzy and fell, secondary to medications that she is taking at home  Complains of dull, 3/10. Intermittent pain with activity relieved by rest. Associated bruising and swelling  Initial treatment splint after emergency room visit  Review of systems unsteady gait with dizziness,anxiety and depression , fatigue   Physical Exam  Nursing note and vitals reviewed. Constitutional: She is oriented to person, place, and time. She appears well-developed and well-nourished. No distress.  Cardiovascular: Intact distal pulses.   Lymphadenopathy: No inguinal adenopathy noted on the right or left side.  Neurological: She is alert and oriented to person, place, and time. She exhibits normal muscle tone. Coordination normal.  Skin: Skin is warm and dry. No rash noted. She is not diaphoretic. No erythema. No pallor.  Psychiatric: Her behavior is normal. Judgment and thought content normal.  RIGHT ankle exam is essentially benign. No swelling, tenderness. Range of motion deficits, contracture, atrophy, or tremor.  LEFT ankle is tender, swollen, including swelling in the foot. Tenderness over the fracture site decreased range of motion, but passively can be brought to plantarflex neutral position. Weakness of the evertors.  ankle appears to be stable to drawer test.  X-rays from the hospital, show a distal fibular fracture with an intact mortise.  Recommend short Cam Walker weight-bear as tolerated, use a walker for several days until comfortable.  X-ray in 6 weeks

## 2012-06-18 ENCOUNTER — Ambulatory Visit (INDEPENDENT_AMBULATORY_CARE_PROVIDER_SITE_OTHER): Payer: Medicare Other | Admitting: Orthopedic Surgery

## 2012-06-18 ENCOUNTER — Ambulatory Visit (INDEPENDENT_AMBULATORY_CARE_PROVIDER_SITE_OTHER): Payer: Medicare Other

## 2012-06-18 VITALS — BP 100/64 | Ht 64.0 in | Wt 180.0 lb

## 2012-06-18 DIAGNOSIS — S82899A Other fracture of unspecified lower leg, initial encounter for closed fracture: Secondary | ICD-10-CM

## 2012-06-18 DIAGNOSIS — S82892A Other fracture of left lower leg, initial encounter for closed fracture: Secondary | ICD-10-CM

## 2012-06-18 NOTE — Progress Notes (Signed)
Patient ID: Sara Vance, female   DOB: 02-Jun-1975, 37 y.o.   MRN: [id number] Chief Complaint  Patient presents with  . Follow-up    6 week recheck left ankle fracture DOI 05/01/12    BP 100/64  Ht 5\' 4"  (1.626 m)  Wt 180 lb (81.647 kg)  BMI 30.88 kg/m2  LMP 06/14/2012  Ankle fracture, left - Plan: DG Ankle Complete Left    6 weeks 6 days distal lateral malleolar fracture treated with fracture brace/short boot  Still complains of pain x-ray show fracture is healing minimal displacement mortise is intact  Recommend followup in 3 weeks continue bracing with the Cam Walker weightbearing as tolerated x-ray next visit

## 2012-06-18 NOTE — Patient Instructions (Addendum)
Continue to wear boot for 3 more weeks

## 2012-07-09 ENCOUNTER — Ambulatory Visit (INDEPENDENT_AMBULATORY_CARE_PROVIDER_SITE_OTHER): Payer: Medicare Other

## 2012-07-09 ENCOUNTER — Ambulatory Visit (INDEPENDENT_AMBULATORY_CARE_PROVIDER_SITE_OTHER): Payer: Medicare Other | Admitting: Orthopedic Surgery

## 2012-07-09 VITALS — BP 90/60 | Ht 64.0 in | Wt 180.0 lb

## 2012-07-09 DIAGNOSIS — S8290XD Unspecified fracture of unspecified lower leg, subsequent encounter for closed fracture with routine healing: Secondary | ICD-10-CM

## 2012-07-09 DIAGNOSIS — S82892D Other fracture of left lower leg, subsequent encounter for closed fracture with routine healing: Secondary | ICD-10-CM

## 2012-07-09 NOTE — Patient Instructions (Signed)
Ankle exercises  

## 2012-07-09 NOTE — Progress Notes (Signed)
Left ankle fracture followup date of injury January 2 treatment Cam Walker today x-ray  X-ray shows complete consolidation of the fracture  Exam shows no fracture tenderness but painful plantar flexion reasonable dorsiflexion  Recommend ankle exercises with the alpha bit and dorsiflexion plantar flexion return to normal shoes followup as needed

## 2012-07-14 ENCOUNTER — Other Ambulatory Visit (HOSPITAL_COMMUNITY): Payer: Self-pay | Admitting: Pulmonary Disease

## 2012-07-14 DIAGNOSIS — R55 Syncope and collapse: Secondary | ICD-10-CM

## 2012-07-18 ENCOUNTER — Ambulatory Visit (HOSPITAL_COMMUNITY)
Admission: RE | Admit: 2012-07-18 | Discharge: 2012-07-18 | Disposition: A | Discharge status: Home / Self Care | Payer: Medicare Other | Source: Ambulatory Visit | Attending: Pulmonary Disease | Admitting: Pulmonary Disease

## 2012-07-18 DIAGNOSIS — R42 Dizziness and giddiness: Secondary | ICD-10-CM | POA: Insufficient documentation

## 2012-07-18 DIAGNOSIS — R55 Syncope and collapse: Secondary | ICD-10-CM | POA: Insufficient documentation

## 2012-07-18 DIAGNOSIS — R748 Abnormal levels of other serum enzymes: Secondary | ICD-10-CM | POA: Insufficient documentation

## 2015-03-31 ENCOUNTER — Other Ambulatory Visit (HOSPITAL_COMMUNITY): Payer: Self-pay | Admitting: Respiratory Therapy

## 2015-03-31 DIAGNOSIS — G4730 Sleep apnea, unspecified: Secondary | ICD-10-CM

## 2015-05-13 DIAGNOSIS — F902 Attention-deficit hyperactivity disorder, combined type: Secondary | ICD-10-CM | POA: Diagnosis not present

## 2015-05-13 DIAGNOSIS — F251 Schizoaffective disorder, depressive type: Secondary | ICD-10-CM | POA: Diagnosis not present

## 2015-06-10 DIAGNOSIS — F251 Schizoaffective disorder, depressive type: Secondary | ICD-10-CM | POA: Diagnosis not present

## 2015-06-10 DIAGNOSIS — F902 Attention-deficit hyperactivity disorder, combined type: Secondary | ICD-10-CM | POA: Diagnosis not present

## 2015-07-07 DIAGNOSIS — F251 Schizoaffective disorder, depressive type: Secondary | ICD-10-CM | POA: Diagnosis not present

## 2015-07-07 DIAGNOSIS — F902 Attention-deficit hyperactivity disorder, combined type: Secondary | ICD-10-CM | POA: Diagnosis not present

## 2015-08-29 DIAGNOSIS — F251 Schizoaffective disorder, depressive type: Secondary | ICD-10-CM | POA: Diagnosis not present

## 2015-08-31 DIAGNOSIS — F251 Schizoaffective disorder, depressive type: Secondary | ICD-10-CM | POA: Diagnosis not present

## 2015-10-03 DIAGNOSIS — F251 Schizoaffective disorder, depressive type: Secondary | ICD-10-CM | POA: Diagnosis not present

## 2015-11-29 DIAGNOSIS — F900 Attention-deficit hyperactivity disorder, predominantly inattentive type: Secondary | ICD-10-CM | POA: Diagnosis not present

## 2015-11-29 DIAGNOSIS — F251 Schizoaffective disorder, depressive type: Secondary | ICD-10-CM | POA: Diagnosis not present

## 2015-12-16 DIAGNOSIS — F900 Attention-deficit hyperactivity disorder, predominantly inattentive type: Secondary | ICD-10-CM | POA: Diagnosis not present

## 2015-12-16 DIAGNOSIS — F251 Schizoaffective disorder, depressive type: Secondary | ICD-10-CM | POA: Diagnosis not present

## 2016-01-11 DIAGNOSIS — F900 Attention-deficit hyperactivity disorder, predominantly inattentive type: Secondary | ICD-10-CM | POA: Diagnosis not present

## 2016-01-11 DIAGNOSIS — F251 Schizoaffective disorder, depressive type: Secondary | ICD-10-CM | POA: Diagnosis not present

## 2016-02-08 DIAGNOSIS — F251 Schizoaffective disorder, depressive type: Secondary | ICD-10-CM | POA: Diagnosis not present

## 2016-02-08 DIAGNOSIS — F900 Attention-deficit hyperactivity disorder, predominantly inattentive type: Secondary | ICD-10-CM | POA: Diagnosis not present

## 2016-02-29 DIAGNOSIS — F251 Schizoaffective disorder, depressive type: Secondary | ICD-10-CM | POA: Diagnosis not present

## 2016-02-29 DIAGNOSIS — F902 Attention-deficit hyperactivity disorder, combined type: Secondary | ICD-10-CM | POA: Diagnosis not present

## 2016-03-09 DIAGNOSIS — F251 Schizoaffective disorder, depressive type: Secondary | ICD-10-CM | POA: Diagnosis not present

## 2016-03-09 DIAGNOSIS — F902 Attention-deficit hyperactivity disorder, combined type: Secondary | ICD-10-CM | POA: Diagnosis not present

## 2016-05-11 DIAGNOSIS — F251 Schizoaffective disorder, depressive type: Secondary | ICD-10-CM | POA: Diagnosis not present

## 2016-05-11 DIAGNOSIS — F902 Attention-deficit hyperactivity disorder, combined type: Secondary | ICD-10-CM | POA: Diagnosis not present

## 2016-05-30 DIAGNOSIS — F251 Schizoaffective disorder, depressive type: Secondary | ICD-10-CM | POA: Diagnosis not present

## 2016-06-13 DIAGNOSIS — F902 Attention-deficit hyperactivity disorder, combined type: Secondary | ICD-10-CM | POA: Diagnosis not present

## 2016-06-13 DIAGNOSIS — F251 Schizoaffective disorder, depressive type: Secondary | ICD-10-CM | POA: Diagnosis not present

## 2016-07-11 DIAGNOSIS — F310 Bipolar disorder, current episode hypomanic: Secondary | ICD-10-CM | POA: Diagnosis not present

## 2016-07-11 DIAGNOSIS — F251 Schizoaffective disorder, depressive type: Secondary | ICD-10-CM | POA: Diagnosis not present

## 2016-07-11 DIAGNOSIS — F900 Attention-deficit hyperactivity disorder, predominantly inattentive type: Secondary | ICD-10-CM | POA: Diagnosis not present

## 2016-08-08 DIAGNOSIS — F251 Schizoaffective disorder, depressive type: Secondary | ICD-10-CM | POA: Diagnosis not present

## 2016-08-08 DIAGNOSIS — F310 Bipolar disorder, current episode hypomanic: Secondary | ICD-10-CM | POA: Diagnosis not present

## 2016-08-22 DIAGNOSIS — F251 Schizoaffective disorder, depressive type: Secondary | ICD-10-CM | POA: Diagnosis not present

## 2016-09-05 DIAGNOSIS — F310 Bipolar disorder, current episode hypomanic: Secondary | ICD-10-CM | POA: Diagnosis not present

## 2016-09-05 DIAGNOSIS — F251 Schizoaffective disorder, depressive type: Secondary | ICD-10-CM | POA: Diagnosis not present

## 2016-10-17 DIAGNOSIS — F900 Attention-deficit hyperactivity disorder, predominantly inattentive type: Secondary | ICD-10-CM | POA: Diagnosis not present

## 2016-10-17 DIAGNOSIS — F310 Bipolar disorder, current episode hypomanic: Secondary | ICD-10-CM | POA: Diagnosis not present

## 2016-10-17 DIAGNOSIS — F251 Schizoaffective disorder, depressive type: Secondary | ICD-10-CM | POA: Diagnosis not present

## 2016-11-29 DIAGNOSIS — F251 Schizoaffective disorder, depressive type: Secondary | ICD-10-CM | POA: Diagnosis not present

## 2017-01-02 DIAGNOSIS — F251 Schizoaffective disorder, depressive type: Secondary | ICD-10-CM | POA: Diagnosis not present

## 2017-01-02 DIAGNOSIS — F900 Attention-deficit hyperactivity disorder, predominantly inattentive type: Secondary | ICD-10-CM | POA: Diagnosis not present

## 2017-01-07 DIAGNOSIS — Z23 Encounter for immunization: Secondary | ICD-10-CM | POA: Diagnosis not present

## 2017-01-07 DIAGNOSIS — Z0000 Encounter for general adult medical examination without abnormal findings: Secondary | ICD-10-CM | POA: Diagnosis not present

## 2017-01-10 DIAGNOSIS — F489 Nonpsychotic mental disorder, unspecified: Secondary | ICD-10-CM | POA: Diagnosis not present

## 2017-01-10 DIAGNOSIS — E669 Obesity, unspecified: Secondary | ICD-10-CM | POA: Diagnosis not present

## 2017-01-10 DIAGNOSIS — K589 Irritable bowel syndrome without diarrhea: Secondary | ICD-10-CM | POA: Diagnosis not present

## 2017-01-10 DIAGNOSIS — Z0000 Encounter for general adult medical examination without abnormal findings: Secondary | ICD-10-CM | POA: Diagnosis not present

## 2017-05-29 DIAGNOSIS — F900 Attention-deficit hyperactivity disorder, predominantly inattentive type: Secondary | ICD-10-CM | POA: Diagnosis not present

## 2017-05-29 DIAGNOSIS — F251 Schizoaffective disorder, depressive type: Secondary | ICD-10-CM | POA: Diagnosis not present

## 2017-07-19 ENCOUNTER — Telehealth: Payer: Self-pay | Admitting: Obstetrics & Gynecology

## 2017-07-19 NOTE — Telephone Encounter (Signed)
Spoke with pt. Pt states she has a rash down there and when she urinates, it causes burning. I advised that Dr. Elonda Husky was not in the office today and it may be best for her to call her PCP. She says she is not letting Dr. Luan Pulling look down there. Pt has tried Monistat. I advised that's for a yeast infection and it wouldn't help a rash. Spoke with Tish, RN and she  recommended to try Hydrocortisone cream over the weekend and see if that helps. Place on the outside, not inside. Can call back Monday if no improvement. Pt voiced understanding. [place]

## 2017-07-19 NOTE — Telephone Encounter (Signed)
Patient called stating that she has a rash down there and that every time she uses the bathroom it burn because it hit the rash part please contact

## 2017-08-21 DIAGNOSIS — F900 Attention-deficit hyperactivity disorder, predominantly inattentive type: Secondary | ICD-10-CM | POA: Diagnosis not present

## 2017-08-21 DIAGNOSIS — Z79899 Other long term (current) drug therapy: Secondary | ICD-10-CM | POA: Diagnosis not present

## 2017-08-21 DIAGNOSIS — F251 Schizoaffective disorder, depressive type: Secondary | ICD-10-CM | POA: Diagnosis not present

## 2017-12-19 ENCOUNTER — Encounter: Payer: Self-pay | Admitting: Advanced Practice Midwife

## 2017-12-19 ENCOUNTER — Ambulatory Visit: Payer: PPO | Admitting: Advanced Practice Midwife

## 2017-12-19 ENCOUNTER — Other Ambulatory Visit (HOSPITAL_COMMUNITY): Payer: Self-pay | Admitting: Advanced Practice Midwife

## 2017-12-19 ENCOUNTER — Other Ambulatory Visit: Payer: Self-pay

## 2017-12-19 ENCOUNTER — Encounter (INDEPENDENT_AMBULATORY_CARE_PROVIDER_SITE_OTHER): Payer: Self-pay

## 2017-12-19 VITALS — BP 112/68 | HR 73 | Ht 63.75 in | Wt 210.0 lb

## 2017-12-19 DIAGNOSIS — B372 Candidiasis of skin and nail: Secondary | ICD-10-CM | POA: Diagnosis not present

## 2017-12-19 MED ORDER — NYSTATIN 100000 UNIT/GM EX POWD
Freq: Three times a day (TID) | CUTANEOUS | 5 refills | Status: DC
Start: 2017-12-19 — End: 2019-09-01

## 2017-12-19 MED ORDER — FLUCONAZOLE 150 MG PO TABS: 150.0000 mg | ORAL_TABLET | Freq: Every day | ORAL | 0 refills | Status: AC

## 2017-12-19 NOTE — Patient Instructions (Addendum)
Mid urethral sling placement--there are many different kinds of slings, some of them taken off the market, so those kinds aren't used any more.   Vaginal Hysterectomy--also done to relieve the pressure of the uterus off the bladder

## 2017-12-19 NOTE — Progress Notes (Signed)
Dr. Gayla Doss at [facility] Visit  Patient name: Sara Vance MRN [id number]  Date of birth: 1975/10/15  CC & HPI:  Sara Vance is a 42 y.o. Caucasian female presenting today for vulvar burn and itch, along w/groin crease and abdominal crease erythema/burn/itch for a few months. Used Nystatin inconsistently.   Pertinent History Reviewed:  Medical & Surgical Hx:   Past Medical History:  Diagnosis Date  . Anxiety   . Bipolar 1 disorder ([place])   . IBS (irritable bowel syndrome)    Past Surgical History:  Procedure Laterality Date  . CHOLECYSTECTOMY    . ENDOMETRIAL ABLATION     Family History  Problem Relation Age of Onset  . Heart disease Unknown   . Cancer Unknown   . Arthritis Unknown   . Diabetes Unknown   . Stroke Maternal Grandmother   . Cancer Father   . Crohn's disease Mother   . Cerebral palsy Brother   . ADD / ADHD Daughter     Current Outpatient Medications:  .  acetaminophen (TYLENOL) 500 MG tablet, Take 500 mg by mouth every 6 (six) hours as needed. For headache, Disp: , Rfl:  .  amphetamine-dextroamphetamine (ADDERALL) 20 MG tablet, Take 20 mg by mouth daily.  , Disp: , Rfl:  .  buPROPion (WELLBUTRIN XL) 150 MG 24 hr tablet, Take 450 mg by mouth daily.  , Disp: , Rfl:  .  clonazePAM (KLONOPIN) 1 MG tablet, Take 1 mg by mouth at bedtime., Disp: , Rfl:  .  fluconazole (DIFLUCAN) 150 MG tablet, Take 1 tablet (150 mg total) by mouth daily for 5 doses., Disp: 5 tablet, Rfl: 0 .  gabapentin (NEURONTIN) 800 MG tablet, Take 800 mg by mouth 2 (two) times daily. , Disp: , Rfl:  .  ibuprofen (ADVIL,MOTRIN) 200 MG tablet, Take 400 mg by mouth every 6 (six) hours as needed. For pain, Disp: , Rfl:  .  nystatin (MYCOSTATIN/NYSTOP) powder, Apply topically 3 (three) times daily., Disp: 60 g, Rfl: 5 .  oxybutynin (DITROPAN) 5 MG tablet, Take 5 mg by mouth 2 (two) times daily.  , Disp: , Rfl:  .  perphenazine (TRILAFON) 16 MG tablet, Take  16 mg by mouth daily. , Disp: , Rfl:  .  traZODone (DESYREL) 150 MG tablet, Take 150 mg by mouth at bedtime.  , Disp: , Rfl:  Social History: Reviewed -  reports that she has never smoked. She has never used smokeless tobacco.  Review of Systems:   Constitutional: Negative for fever and chills Eyes: Negative for visual disturbances Respiratory: Negative for shortness of breath, dyspnea Cardiovascular: Negative for chest pain or palpitations  Gastrointestinal: Negative for vomiting, diarrhea and constipation; no abdominal pain Genitourinary: Negative for dysuria and urgency, vaginal irritation or itching Musculoskeletal: Negative for back pain, joint pain, myalgias  Neurological: Negative for dizziness and headaches    Objective Findings:    Physical Examination: Vitals:   12/19/17 0844  BP: 112/68  Pulse: 73   General appearance - well appearing, and in no distress Mental status - alert, oriented to person, place, and time Chest:  Normal respiratory effort Heart - normal rate and regular rhythm Abdomen:  Soft, nontender.  Red line in lower abdominal crease just above pubic hair line and also in her groin creases.  Vulvar skin down to gluteal folds also red, all c/w yeast Pelvic: small amount of yeast like DC in vagina Musculoskeletal:  Normal range of  motion without pain Extremities:  No edema    No results found for this or any previous visit (from the past 24 hour(s)).    Assessment & Plan:  A:   Yeast P:  All red areas painted w/gentian violet.  Difulcan 150mg  PO x5 days.  Check Hgb A1C   Return for If you have any problems.  Christin Fudge CNM 12/19/2017 7:49 PM

## 2017-12-20 LAB — HGB A1C W/O EAG: Hgb A1c MFr Bld: 5.1 % (ref 4.8–5.6)

## 2018-01-14 DIAGNOSIS — F251 Schizoaffective disorder, depressive type: Secondary | ICD-10-CM | POA: Diagnosis not present

## 2018-01-14 DIAGNOSIS — F900 Attention-deficit hyperactivity disorder, predominantly inattentive type: Secondary | ICD-10-CM | POA: Diagnosis not present

## 2018-01-23 ENCOUNTER — Other Ambulatory Visit: Payer: Self-pay | Admitting: Obstetrics & Gynecology

## 2018-01-23 ENCOUNTER — Ambulatory Visit: Payer: PPO | Admitting: Obstetrics & Gynecology

## 2018-01-23 ENCOUNTER — Other Ambulatory Visit (HOSPITAL_COMMUNITY)
Admission: RE | Admit: 2018-01-23 | Discharge: 2018-01-23 | Disposition: A | Discharge status: Home / Self Care | Payer: PPO | Source: Ambulatory Visit | Attending: Obstetrics & Gynecology | Admitting: Obstetrics & Gynecology

## 2018-01-23 ENCOUNTER — Encounter: Payer: Self-pay | Admitting: Obstetrics & Gynecology

## 2018-01-23 ENCOUNTER — Other Ambulatory Visit: Payer: Self-pay

## 2018-01-23 VITALS — BP 118/74 | HR 87 | Ht 63.75 in | Wt 206.0 lb

## 2018-01-23 DIAGNOSIS — Z01419 Encounter for gynecological examination (general) (routine) without abnormal findings: Secondary | ICD-10-CM | POA: Diagnosis not present

## 2018-01-23 DIAGNOSIS — Z1231 Encounter for screening mammogram for malignant neoplasm of breast: Secondary | ICD-10-CM

## 2018-01-23 NOTE — Progress Notes (Signed)
Subjective:     Sara Vance is a 42 y.o. female here for a routine exam.  No LMP recorded. Patient has had an ablation. G1P1 Birth Control Method:  Endo ablation Menstrual Calendar(currently): amenorrhea  Current complaints: none.   Current acute medical issues:  bipolare stable   Recent Gynecologic History No LMP recorded. Patient has had an ablation. Last Pap: 2013,  normal Last mammogram: 10 years,    Past Medical History:  Diagnosis Date  . Anxiety   . Bipolar 1 disorder ([place])   . IBS (irritable bowel syndrome)     Past Surgical History:  Procedure Laterality Date  . CHOLECYSTECTOMY    . ENDOMETRIAL ABLATION      OB History    Gravida  1   Para  1   Term      Preterm      AB      Living  1     SAB      TAB      Ectopic      Multiple      Live Births              Social History   Socioeconomic History  . Marital status: Divorced    Spouse name: Not on file  . Number of children: Not on file  . Years of education: Not on file  . Highest education level: Not on file  Occupational History  . Not on file  Social Needs  . Financial resource strain: Not on file  . Food insecurity:    Worry: Not on file    Inability: Not on file  . Transportation needs:    Medical: Not on file    Non-medical: Not on file  Tobacco Use  . Smoking status: Never Smoker  . Smokeless tobacco: Never Used  Substance and Sexual Activity  . Alcohol use: No  . Drug use: No  . Sexual activity: Not Currently    Birth control/protection: None  Lifestyle  . Physical activity:    Days per week: Not on file    Minutes per session: Not on file  . Stress: Not on file  Relationships  . Social connections:    Talks on phone: Not on file    Gets together: Not on file    Attends religious service: Not on file    Active member of club or organization: Not on file    Attends meetings of clubs or organizations: Not on file    Relationship status: Not on file  Other  Topics Concern  . Not on file  Social History Narrative  . Not on file    Family History  Problem Relation Age of Onset  . Heart disease Unknown   . Cancer Unknown   . Arthritis Unknown   . Diabetes Unknown   . Stroke Maternal Grandmother   . Cancer Father   . Crohn's disease Mother   . Cerebral palsy Brother   . ADD / ADHD Daughter      Current Outpatient Medications:  .  acetaminophen (TYLENOL) 500 MG tablet, Take 500 mg by mouth every 6 (six) hours as needed. For headache, Disp: , Rfl:  .  amphetamine-dextroamphetamine (ADDERALL) 20 MG tablet, Take 20 mg by mouth daily.  , Disp: , Rfl:  .  buPROPion (WELLBUTRIN XL) 150 MG 24 hr tablet, Take 450 mg by mouth daily.  , Disp: , Rfl:  .  clonazePAM (KLONOPIN) 1 MG tablet, Take 1 mg  by mouth at bedtime., Disp: , Rfl:  .  gabapentin (NEURONTIN) 800 MG tablet, Take 800 mg by mouth 2 (two) times daily. , Disp: , Rfl:  .  ibuprofen (ADVIL,MOTRIN) 200 MG tablet, Take 400 mg by mouth every 6 (six) hours as needed. For pain, Disp: , Rfl:  .  nystatin (MYCOSTATIN/NYSTOP) powder, Apply topically 3 (three) times daily., Disp: 60 g, Rfl: 5 .  oxybutynin (DITROPAN) 5 MG tablet, Take 5 mg by mouth 2 (two) times daily.  , Disp: , Rfl:  .  perphenazine (TRILAFON) 16 MG tablet, Take 16 mg by mouth daily. , Disp: , Rfl:  .  traZODone (DESYREL) 150 MG tablet, Take 300 mg by mouth at bedtime. , Disp: , Rfl:   Review of Systems  Review of Systems  Constitutional: Negative for fever, chills, weight loss, malaise/fatigue and diaphoresis.  HENT: Negative for hearing loss, ear pain, nosebleeds, congestion, sore throat, neck pain, tinnitus and ear discharge.   Eyes: Negative for blurred vision, double vision, photophobia, pain, discharge and redness.  Respiratory: Negative for cough, hemoptysis, sputum production, shortness of breath, wheezing and stridor.   Cardiovascular: Negative for chest pain, palpitations, orthopnea, claudication, leg swelling and  PND.  Gastrointestinal: negative for abdominal pain. Negative for heartburn, nausea, vomiting, diarrhea, constipation, blood in stool and melena.  Genitourinary: Negative for dysuria, urgency, frequency, hematuria and flank pain.  Musculoskeletal: Negative for myalgias, back pain, joint pain and falls.  Skin: Negative for itching and rash.  Neurological: Negative for dizziness, tingling, tremors, sensory change, speech change, focal weakness, seizures, loss of consciousness, weakness and headaches.  Endo/Heme/Allergies: Negative for environmental allergies and polydipsia. Does not bruise/bleed easily.  Psychiatric/Behavioral: Negative for depression, suicidal ideas, hallucinations, memory loss and substance abuse. The patient is not nervous/anxious and does not have insomnia.        Objective:  Blood pressure 118/74, pulse 87, height 5' 3.75" (1.619 m), weight 206 lb (93.4 kg).   Physical Exam  Vitals reviewed. Constitutional: She is oriented to person, place, and time. She appears well-developed and well-nourished.  HENT:  Head: Normocephalic and atraumatic.        Right Ear: External ear normal.  Left Ear: External ear normal.  Nose: Nose normal.  Mouth/Throat: Oropharynx is clear and moist.  Eyes: Conjunctivae and EOM are normal. Pupils are equal, round, and reactive to light. Right eye exhibits no discharge. Left eye exhibits no discharge. No scleral icterus.  Neck: Normal range of motion. Neck supple. No tracheal deviation present. No thyromegaly present.  Cardiovascular: Normal rate, regular rhythm, normal heart sounds and intact distal pulses.  Exam reveals no gallop and no friction rub.   No murmur heard. Respiratory: Effort normal and breath sounds normal. No respiratory distress. She has no wheezes. She has no rales. She exhibits no tenderness.  GI: Soft. Bowel sounds are normal. She exhibits no distension and no mass. There is no tenderness. There is no rebound and no guarding.   Genitourinary:  Breasts no masses skin changes or nipple changes bilaterally      Vulva is normal without lesions Vagina is pink moist without discharge Cervix normal in appearance and pap is done Uterus is normal size shape and contour Adnexa is negative with normal sized ovaries   Musculoskeletal: Normal range of motion. She exhibits no edema and no tenderness.  Neurological: She is alert and oriented to person, place, and time. She has normal reflexes. She displays normal reflexes. No cranial nerve deficit. She exhibits normal  muscle tone. Coordination normal.  Skin: Skin is warm and dry. No rash noted. No erythema. No pallor.  Psychiatric: She has a normal mood and affect. Her behavior is normal. Judgment and thought content normal.       Medications Ordered at today's visit: No orders of the defined types were placed in this encounter.   Other orders placed at today's visit: No orders of the defined types were placed in this encounter.     Assessment:    Healthy female exam.    Plan:    Mammogram ordered. Follow up in: 2 years.     Return in about 2 years (around 01/24/2020) for yearly.

## 2018-01-27 LAB — CYTOLOGY - PAP
DIAGNOSIS: NEGATIVE
HPV: NOT DETECTED

## 2018-01-31 ENCOUNTER — Ambulatory Visit (HOSPITAL_COMMUNITY)
Admission: RE | Admit: 2018-01-31 | Discharge: 2018-01-31 | Disposition: A | Discharge status: Home / Self Care | Payer: PPO | Source: Ambulatory Visit | Attending: Obstetrics & Gynecology | Admitting: Obstetrics & Gynecology

## 2018-01-31 ENCOUNTER — Encounter (HOSPITAL_COMMUNITY): Payer: Self-pay

## 2018-01-31 DIAGNOSIS — Z1231 Encounter for screening mammogram for malignant neoplasm of breast: Secondary | ICD-10-CM | POA: Insufficient documentation

## 2018-02-10 DIAGNOSIS — L02212 Cutaneous abscess of back [any part, except buttock]: Secondary | ICD-10-CM | POA: Diagnosis not present

## 2018-02-10 DIAGNOSIS — F319 Bipolar disorder, unspecified: Secondary | ICD-10-CM | POA: Diagnosis not present

## 2018-02-10 DIAGNOSIS — Z23 Encounter for immunization: Secondary | ICD-10-CM | POA: Diagnosis not present

## 2018-02-10 DIAGNOSIS — E669 Obesity, unspecified: Secondary | ICD-10-CM | POA: Diagnosis not present

## 2018-05-20 DIAGNOSIS — F251 Schizoaffective disorder, depressive type: Secondary | ICD-10-CM | POA: Diagnosis not present

## 2018-06-12 DIAGNOSIS — Z0000 Encounter for general adult medical examination without abnormal findings: Secondary | ICD-10-CM | POA: Diagnosis not present

## 2018-08-19 DIAGNOSIS — F251 Schizoaffective disorder, depressive type: Secondary | ICD-10-CM | POA: Diagnosis not present

## 2018-08-19 DIAGNOSIS — F900 Attention-deficit hyperactivity disorder, predominantly inattentive type: Secondary | ICD-10-CM | POA: Diagnosis not present

## 2018-10-08 DIAGNOSIS — Z79891 Long term (current) use of opiate analgesic: Secondary | ICD-10-CM | POA: Diagnosis not present

## 2018-10-08 DIAGNOSIS — F310 Bipolar disorder, current episode hypomanic: Secondary | ICD-10-CM | POA: Diagnosis not present

## 2018-10-08 DIAGNOSIS — F900 Attention-deficit hyperactivity disorder, predominantly inattentive type: Secondary | ICD-10-CM | POA: Diagnosis not present

## 2018-10-14 DIAGNOSIS — H524 Presbyopia: Secondary | ICD-10-CM | POA: Diagnosis not present

## 2018-10-14 DIAGNOSIS — H25813 Combined forms of age-related cataract, bilateral: Secondary | ICD-10-CM | POA: Diagnosis not present

## 2018-10-14 DIAGNOSIS — H52203 Unspecified astigmatism, bilateral: Secondary | ICD-10-CM | POA: Diagnosis not present

## 2018-10-22 DIAGNOSIS — H2511 Age-related nuclear cataract, right eye: Secondary | ICD-10-CM | POA: Diagnosis not present

## 2018-10-22 DIAGNOSIS — H25812 Combined forms of age-related cataract, left eye: Secondary | ICD-10-CM | POA: Diagnosis not present

## 2018-10-22 DIAGNOSIS — H25011 Cortical age-related cataract, right eye: Secondary | ICD-10-CM | POA: Diagnosis not present

## 2018-10-29 DIAGNOSIS — H2512 Age-related nuclear cataract, left eye: Secondary | ICD-10-CM | POA: Diagnosis not present

## 2018-10-29 DIAGNOSIS — H25042 Posterior subcapsular polar age-related cataract, left eye: Secondary | ICD-10-CM | POA: Diagnosis not present

## 2018-11-27 DIAGNOSIS — F251 Schizoaffective disorder, depressive type: Secondary | ICD-10-CM | POA: Diagnosis not present

## 2018-12-11 DIAGNOSIS — K589 Irritable bowel syndrome without diarrhea: Secondary | ICD-10-CM | POA: Diagnosis not present

## 2018-12-11 DIAGNOSIS — N3281 Overactive bladder: Secondary | ICD-10-CM | POA: Diagnosis not present

## 2018-12-11 DIAGNOSIS — E669 Obesity, unspecified: Secondary | ICD-10-CM | POA: Diagnosis not present

## 2018-12-11 DIAGNOSIS — F319 Bipolar disorder, unspecified: Secondary | ICD-10-CM | POA: Diagnosis not present

## 2018-12-29 ENCOUNTER — Other Ambulatory Visit: Payer: Self-pay

## 2018-12-29 ENCOUNTER — Encounter: Payer: Self-pay | Admitting: Family Medicine

## 2018-12-29 ENCOUNTER — Ambulatory Visit (INDEPENDENT_AMBULATORY_CARE_PROVIDER_SITE_OTHER): Payer: PPO | Admitting: Family Medicine

## 2018-12-29 VITALS — BP 116/74 | HR 89 | Temp 98.6°F | Ht 63.0 in | Wt 201.8 lb

## 2018-12-29 DIAGNOSIS — N39490 Overflow incontinence: Secondary | ICD-10-CM

## 2018-12-29 DIAGNOSIS — N393 Stress incontinence (female) (male): Secondary | ICD-10-CM | POA: Diagnosis not present

## 2018-12-29 DIAGNOSIS — K589 Irritable bowel syndrome without diarrhea: Secondary | ICD-10-CM | POA: Diagnosis not present

## 2018-12-29 MED ORDER — OXYBUTYNIN CHLORIDE 5 MG PO TABS: 5.0000 mg | ORAL_TABLET | Freq: Two times a day (BID) | ORAL | 1 refills | Status: DC

## 2018-12-29 NOTE — Progress Notes (Signed)
New Patient Office Visit  Subjective:  Patient ID: Sara Vance, female    DOB: 09/20/1975  Age: 43 y.o. MRN: [id number]  CC:  Chief Complaint  Patient presents with  . New Patient (Initial Visit)  urinary incontinence -needs medication  HPI Sara Vance presents for incontinency-urine. Pt states she can not make it to the [place] if someone is in the bathroom and she needs to go. Pt states looses urine if she cough/laughs. Pt examined by GYN with recommendation for hysterectomy per pt due to incontinency. Pt given ditropan -twice  Day. Pt has dizziness. Pt seen by PSY-Dr. Reece Vance in [place] for remaining medications. Pt with ho suicide attempts.  Pt states since having her daughter she has not desire to hurt herself or others. Pt states she feels medication have allowed her stability. Pt lives with her parents. Pt states she is disabled due to depression/bipolar. Dr. Luan Vance recommended lab work but she has not completed.   Past Medical History:  Diagnosis Date  . Anxiety   . Bipolar 1 disorder ([place])   . IBS (irritable bowel syndrome)     Past Surgical History:  Procedure Laterality Date  . CHOLECYSTECTOMY    . ENDOMETRIAL ABLATION      Family History  Problem Relation Age of Onset  . Heart disease Other   . Cancer Other   . Arthritis Other   . Diabetes Other   . Stroke Maternal Grandmother   . Cancer Father   . Crohn's disease Mother   . Cerebral palsy Brother   . ADD / ADHD Daughter     Social History  Divorced x 2, 16yo daughter-ADHD-homeschooled Socioeconomic History  . Marital status: Divorced    Spouse name: Not on file  . Number of children: Not on file  . Years of education: Not on file  . Highest education level: Not on file  Occupational History  . Not on file  Social Needs  . Financial resource strain: Not on file  . Food insecurity    Worry: Not on file    Inability: Not on file  . Transportation needs    Medical: Not on file     Non-medical: Not on file  Tobacco Use  . Smoking status: Never Smoker  . Smokeless tobacco: Never Used  Substance and Sexual Activity  . Alcohol use: No  . Drug use: No  . Sexual activity: Not Currently    Birth control/protection: None  Lifestyle  . Physical activity    Days per week: Not on file    Minutes per session: Not on file  . Stress: Not on file  Relationships  . Social Herbalist on phone: Not on file    Gets together: Not on file    Attends religious service: Not on file    Active member of club or organization: Not on file    Attends meetings of clubs or organizations: Not on file    Relationship status: Not on file  . Intimate partner violence    Fear of current or ex partner: Not on file    Emotionally abused: Not on file    Physically abused: Not on file    Forced sexual activity: Not on file  Other Topics Concern  . Not on file  Social History Narrative  . Not on file    ROS Review of Systems  Constitutional: Negative for fatigue and fever.  Respiratory: Negative for cough and chest tightness.  Cardiovascular: Negative for chest pain.  Gastrointestinal:       IBS-constipated then "blow out" per pt  Genitourinary: Positive for urgency.       Incontiency  Psychiatric/Behavioral: Positive for dysphoric mood, self-injury and suicidal ideas.       Currently stable-followed by psy SI in the past-none w/i last few years Sees psy monthly    Objective:   Today's Vitals: BP 116/74 (BP Location: Right Wrist, Patient Position: Sitting, Cuff Size: Normal)   Pulse 89   Temp 98.6 F (37 C) (Oral)   Ht 5\' 3"  (1.6 m)   Wt 201 lb 12.8 oz (91.5 kg)   SpO2 95%   BMI 35.75 kg/m   Physical Exam Constitutional:      Appearance: She is obese.  HENT:     Right Ear: Tympanic membrane, ear canal and external ear normal.     Left Ear: Tympanic membrane, ear canal and external ear normal.     Nose: Nose normal.  Eyes:     Conjunctiva/sclera:  Conjunctivae normal.  Cardiovascular:     Rate and Rhythm: Regular rhythm.     Pulses: Normal pulses.     Heart sounds: Normal heart sounds.  Pulmonary:     Effort: Pulmonary effort is normal.     Breath sounds: Normal breath sounds.  Neurological:     General: No focal deficit present.     Mental Status: She is alert.  Psychiatric:        Mood and Affect: Mood normal.        Behavior: Behavior normal.     Assessment & Plan:    Outpatient Encounter Medications as of 12/29/2018  Medication Sig  . acetaminophen (TYLENOL) 500 MG tablet Take 500 mg by mouth every 6 (six) hours as needed. For headache  . amphetamine-dextroamphetamine (ADDERALL) 20 MG tablet Take 20 mg by mouth daily.    Marland Kitchen buPROPion (WELLBUTRIN XL) 150 MG 24 hr tablet Take 450 mg by mouth daily.    . clonazePAM (KLONOPIN) 1 MG tablet Take 1 mg by mouth at bedtime.  . gabapentin (NEURONTIN) 800 MG tablet Take 1,200 mg by mouth 2 (two) times daily. 400 in the mornings 800 at bedtime  . ibuprofen (ADVIL,MOTRIN) 200 MG tablet Take 400 mg by mouth every 6 (six) hours as needed. For pain  . nystatin (MYCOSTATIN/NYSTOP) powder Apply topically 3 (three) times daily.  Marland Kitchen oxybutynin (DITROPAN) 5 MG tablet Take 5 mg by mouth 2 (two) times daily.    Marland Kitchen perphenazine (TRILAFON) 16 MG tablet Take 16 mg by mouth daily.   . traZODone (DESYREL) 150 MG tablet Take 300 mg by mouth at bedtime.    No facility-administered encounter medications on file as of 12/29/2018.    1. Incontinence overflow, stress female Refill on ditropan, referral for eval to urology - Ambulatory referral to Urology Concern for stress incontinence   2. Irritable bowel syndrome, unspecified type miralax suggested-no blood in stool Follow-up: pt to have blood work completed as per Dr. Kathyrn Vance follow if needed after completed   Keep appt at GYN for evaluation per recommendation 2018-LDL 90, TC 157 2918-Glucose 95 2019-A1c  Sara Vance Sara Beat, MD

## 2018-12-29 NOTE — Patient Instructions (Signed)
Trial of miralax Urology referral Complete labwork as recommended by Dr. Luan Pulling

## 2019-02-03 ENCOUNTER — Ambulatory Visit: Payer: No Typology Code available for payment source | Admitting: Urology

## 2019-02-26 DIAGNOSIS — F900 Attention-deficit hyperactivity disorder, predominantly inattentive type: Secondary | ICD-10-CM | POA: Diagnosis not present

## 2019-02-26 DIAGNOSIS — F251 Schizoaffective disorder, depressive type: Secondary | ICD-10-CM | POA: Diagnosis not present

## 2019-05-28 DIAGNOSIS — Z79891 Long term (current) use of opiate analgesic: Secondary | ICD-10-CM | POA: Diagnosis not present

## 2019-05-28 DIAGNOSIS — F900 Attention-deficit hyperactivity disorder, predominantly inattentive type: Secondary | ICD-10-CM | POA: Diagnosis not present

## 2019-05-28 DIAGNOSIS — F251 Schizoaffective disorder, depressive type: Secondary | ICD-10-CM | POA: Diagnosis not present

## 2019-06-15 DIAGNOSIS — F900 Attention-deficit hyperactivity disorder, predominantly inattentive type: Secondary | ICD-10-CM | POA: Diagnosis not present

## 2019-06-15 DIAGNOSIS — F251 Schizoaffective disorder, depressive type: Secondary | ICD-10-CM | POA: Diagnosis not present

## 2019-07-01 DIAGNOSIS — F251 Schizoaffective disorder, depressive type: Secondary | ICD-10-CM | POA: Diagnosis not present

## 2019-07-01 DIAGNOSIS — F900 Attention-deficit hyperactivity disorder, predominantly inattentive type: Secondary | ICD-10-CM | POA: Diagnosis not present

## 2019-07-20 ENCOUNTER — Other Ambulatory Visit: Payer: Self-pay

## 2019-07-20 ENCOUNTER — Ambulatory Visit (INDEPENDENT_AMBULATORY_CARE_PROVIDER_SITE_OTHER): Payer: PPO | Admitting: Family Medicine

## 2019-07-20 ENCOUNTER — Encounter: Payer: Self-pay | Admitting: Family Medicine

## 2019-07-20 VITALS — BP 101/62 | HR 69 | Temp 97.6°F | Ht 64.0 in | Wt 195.0 lb

## 2019-07-20 DIAGNOSIS — M79672 Pain in left foot: Secondary | ICD-10-CM | POA: Diagnosis not present

## 2019-07-20 NOTE — Patient Instructions (Signed)
° ° ° °  If you have lab work done today you will be contacted with your lab results within the next 2 weeks.  If you have not heard from us then please contact us. The fastest way to get your results is to register for My Chart. ° ° °IF you received an x-ray today, you will receive an invoice from [facility]. Please contact [facility] at [phone] with questions or concerns regarding your invoice.  ° °IF you received labwork today, you will receive an invoice from [facility]. Please contact [facility] at [phone] with questions or concerns regarding your invoice.  ° °Our billing staff will not be able to assist you with questions regarding bills from these companies. ° °You will be contacted with the lab results as soon as they are available. The fastest way to get your results is to activate your My Chart account. Instructions are located on the last page of this paperwork. If you have not heard from us regarding the results in 2 weeks, please contact this office. °  ° ° ° °

## 2019-07-20 NOTE — Progress Notes (Signed)
Established Patient Office Visit  Subjective:  Patient ID: Sara Vance, female    DOB: 04/06/1976  Age: 44 y.o. MRN: [id number]  CC:  Chief Complaint  Patient presents with  . Fall    fall on 07/16/19 injury to rt leg briuseing and left ankle pain    HPI Sara Vance presents for concern for left ankle sprain. Pt states her daughter ran away and she went after her and fell on a ramp at her home-Thursday 07/16/19. Pt states she slipped on the ramp and fell on her right lower leg and twisted her left ankle.  Pain Scale 5/10 and topical placed on area and took ibuprofen 200mg  (3) this morning. Pt states she has taken 3 ibuprofen in the morning and 3 ibuprofen at night.  Pt using a cane and boot to help walk.  Pt has broken left ankle previously. Pt states long ago-unknown.  Swelling of the left ankle but ice has improved symptoms.  Point tenderness on the top of the foot and medial foot.  Broken vessels noted on the foot.  Bruising noted on the right lower leg.    Past Medical History:  Diagnosis Date  . Anxiety   . Bipolar 1 disorder ([place])   . IBS (irritable bowel syndrome)     Past Surgical History:  Procedure Laterality Date  . CHOLECYSTECTOMY    . ENDOMETRIAL ABLATION      Family History  Problem Relation Age of Onset  . Heart disease Other   . Cancer Other   . Arthritis Other   . Diabetes Other   . Stroke Maternal Grandmother   . Cancer Father   . Crohn's disease Mother   . Cerebral palsy Brother   . ADD / ADHD Daughter     Social History   Socioeconomic History  . Marital status: Divorced    Spouse name: Not on file  . Number of children: Not on file  . Years of education: Not on file  . Highest education level: Not on file  Occupational History  . Not on file  Tobacco Use  . Smoking status: Never Smoker  . Smokeless tobacco: Never Used  Substance and Sexual Activity  . Alcohol use: No  . Drug use: No  . Sexual activity: Not Currently    Birth  control/protection: None  Other Topics Concern  . Not on file  Social History Narrative  . Not on file   Social Determinants of Health   Financial Resource Strain:   . Difficulty of Paying Living Expenses:   Food Insecurity:   . Worried About Charity fundraiser in the Last Year:   . Arboriculturist in the Last Year:   Transportation Needs:   . Film/video editor (Medical):   Marland Kitchen Lack of Transportation (Non-Medical):   Physical Activity:   . Days of Exercise per Week:   . Minutes of Exercise per Session:   Stress:   . Feeling of Stress :   Social Connections:   . Frequency of Communication with Friends and Family:   . Frequency of Social Gatherings with Friends and Family:   . Attends Religious Services:   . Active Member of Clubs or Organizations:   . Attends Archivist Meetings:   Marland Kitchen Marital Status:   Intimate Partner Violence:   . Fear of Current or Ex-Partner:   . Emotionally Abused:   Marland Kitchen Physically Abused:   . Sexually Abused:  Outpatient Medications Prior to Visit  Medication Sig Dispense Refill  . acetaminophen (TYLENOL) 500 MG tablet Take 500 mg by mouth every 6 (six) hours as needed. For headache    . amphetamine-dextroamphetamine (ADDERALL) 20 MG tablet Take 20 mg by mouth daily.      . ARIPiprazole (ABILIFY) 10 MG tablet Take 10 mg by mouth daily.    Marland Kitchen buPROPion (WELLBUTRIN XL) 150 MG 24 hr tablet Take 450 mg by mouth daily.      . clonazePAM (KLONOPIN) 1 MG tablet Take 1 mg by mouth at bedtime.    . gabapentin (NEURONTIN) 800 MG tablet Take 1,200 mg by mouth 2 (two) times daily. 400 in the mornings 800 at bedtime    . ibuprofen (ADVIL,MOTRIN) 200 MG tablet Take 400 mg by mouth every 6 (six) hours as needed. For pain    . nystatin (MYCOSTATIN/NYSTOP) powder Apply topically 3 (three) times daily. 60 g 5  . perphenazine (TRILAFON) 16 MG tablet Take 16 mg by mouth daily.     . traZODone (DESYREL) 150 MG tablet Take 300 mg by mouth at bedtime.     Marland Kitchen  oxybutynin (DITROPAN) 5 MG tablet Take 1 tablet (5 mg total) by mouth 2 (two) times daily. 60 tablet 1   No facility-administered medications prior to visit.    Allergies  Allergen Reactions  . Latex Itching    Bleeding, Redness    ROS Review of Systems  Musculoskeletal:       Left ankle and foot pain after injury  Skin: Positive for color change.       Right lower leg-bruising red-yellow Right foot bruising      Objective:    Physical Exam  Musculoskeletal:        General: Tenderness and edema present.     Left foot: Swelling and tenderness present.       Legs:     Comments: Ecchymosis right lower leg, no pain with palpation  Tenderness to palpation left medial and dorsum of the foot-ecchymosis     BP 101/62 (BP Location: Right Arm, Patient Position: Sitting, Cuff Size: Normal)   Pulse 69   Temp 97.6 F (36.4 C) (Temporal)   Ht 5\' 4"  (1.626 m)   Wt 195 lb (88.5 kg)   SpO2 97%   BMI 33.47 kg/m  Wt Readings from Last 3 Encounters:  07/20/19 195 lb (88.5 kg)  12/29/18 201 lb 12.8 oz (91.5 kg)  01/23/18 206 lb (93.4 kg)     Health Maintenance Due  Topic Date Due  . TETANUS/TDAP  Never done      Lab Results  Component Value Date   TSH 1.638 02/03/2011   Lab Results  Component Value Date   WBC 6.7 05/02/2012   HGB 13.7 05/02/2012   HCT 39.6 05/02/2012   MCV 90.2 05/02/2012   PLT 261 05/02/2012   Lab Results  Component Value Date   NA 137 05/02/2012   K 3.5 05/02/2012   CO2 30 05/02/2012   GLUCOSE 98 05/02/2012   BUN 8 05/02/2012   CREATININE 0.93 05/02/2012   BILITOT 0.4 02/01/2011   ALKPHOS 98 02/01/2011   AST 30 02/01/2011   ALT 35 02/01/2011   PROT 7.4 02/01/2011   ALBUMIN 4.0 02/01/2011   CALCIUM 9.4 05/02/2012   Lab Results  Component Value Date   HGBA1C 5.1 12/19/2017      Assessment & Plan:  1. Foot pain, left Pt refused xray-requested referral to Dr. Aline Brochure for evaluation  and xray-urgent referral. Pt wearing a cast  shoe and using cane. Recommended ACE wrap, elevation, ice, continue ibuprofen q 8 hours with food - Ambulatory referral to Orthopedic Surgery Previous injury to the left foot with ortho evaluation/treatment Follow-up:ortho   Delford Wingert [name], MD

## 2019-07-21 ENCOUNTER — Ambulatory Visit: Payer: PPO

## 2019-07-21 ENCOUNTER — Ambulatory Visit: Payer: PPO | Admitting: Orthopaedic Surgery

## 2019-07-21 ENCOUNTER — Encounter: Payer: Self-pay | Admitting: Orthopaedic Surgery

## 2019-07-21 VITALS — Temp 97.1°F | Ht 64.0 in | Wt 195.0 lb

## 2019-07-21 DIAGNOSIS — M25572 Pain in left ankle and joints of left foot: Secondary | ICD-10-CM

## 2019-07-21 DIAGNOSIS — W102XXA Fall (on)(from) incline, initial encounter: Secondary | ICD-10-CM

## 2019-07-21 NOTE — Progress Notes (Signed)
Subjective:    Patient ID: Sara Vance, female    DOB: 1976/03/15, 44 y.o.   MRN: [id number]  HPI She fell down a ramp on 07-16-2019 and hurt her left ankle.  She also hurt her right leg but that is better.  The left ankle is hurting more. She saw Dr. Holly Bodily and has been referred here.  I have read Dr. Charlestine Massed notes.  She has pain laterally with some swelling and ecchymosis.  It hurts to put weight on it.  She is having no paresthesias.   Review of Systems  Constitutional: Positive for activity change.  Musculoskeletal: Positive for arthralgias, gait problem and joint swelling.  Psychiatric/Behavioral: The patient is nervous/anxious.   All other systems reviewed and are negative.  For Review of Systems, all other systems reviewed and are negative.  The following is a summary of the past history medically, past history surgically, known current medicines, social history and family history.  This information is gathered electronically by the computer from prior information and documentation.  I review this each visit and have found including this information at this point in the chart is beneficial and informative.   Past Medical History:  Diagnosis Date  . Anxiety   . Bipolar 1 disorder ([place])   . IBS (irritable bowel syndrome)     Past Surgical History:  Procedure Laterality Date  . CHOLECYSTECTOMY    . ENDOMETRIAL ABLATION      Current Outpatient Medications on File Prior to Visit  Medication Sig Dispense Refill  . acetaminophen (TYLENOL) 500 MG tablet Take 500 mg by mouth every 6 (six) hours as needed. For headache    . amphetamine-dextroamphetamine (ADDERALL) 20 MG tablet Take 20 mg by mouth daily.      . ARIPiprazole (ABILIFY) 10 MG tablet Take 10 mg by mouth daily.    Marland Kitchen buPROPion (WELLBUTRIN XL) 150 MG 24 hr tablet Take 450 mg by mouth daily.      . clonazePAM (KLONOPIN) 1 MG tablet Take 1 mg by mouth at bedtime.    . gabapentin (NEURONTIN) 800 MG tablet Take 1,200 mg by  mouth 2 (two) times daily. 400 in the mornings 800 at bedtime    . ibuprofen (ADVIL,MOTRIN) 200 MG tablet Take 400 mg by mouth every 6 (six) hours as needed. For pain    . nystatin (MYCOSTATIN/NYSTOP) powder Apply topically 3 (three) times daily. 60 g 5  . perphenazine (TRILAFON) 16 MG tablet Take 16 mg by mouth daily.     . traZODone (DESYREL) 150 MG tablet Take 300 mg by mouth at bedtime.      No current facility-administered medications on file prior to visit.    Social History   Socioeconomic History  . Marital status: Divorced    Spouse name: Not on file  . Number of children: Not on file  . Years of education: Not on file  . Highest education level: Not on file  Occupational History  . Not on file  Tobacco Use  . Smoking status: Never Smoker  . Smokeless tobacco: Never Used  Substance and Sexual Activity  . Alcohol use: No  . Drug use: No  . Sexual activity: Not Currently    Birth control/protection: None  Other Topics Concern  . Not on file  Social History Narrative  . Not on file   Social Determinants of Health   Financial Resource Strain:   . Difficulty of Paying Living Expenses:   Food Insecurity:   . Worried  About Running Out of Food in the Last Year:   . [place] in the Last Year:   Transportation Needs:   . Lack of Transportation (Medical):   Marland Kitchen Lack of Transportation (Non-Medical):   Physical Activity:   . Days of Exercise per Week:   . Minutes of Exercise per Session:   Stress:   . Feeling of Stress :   Social Connections:   . Frequency of Communication with Friends and Family:   . Frequency of Social Gatherings with Friends and Family:   . Attends Religious Services:   . Active Member of Clubs or Organizations:   . Attends Archivist Meetings:   Marland Kitchen Marital Status:   Intimate Partner Violence:   . Fear of Current or Ex-Partner:   . Emotionally Abused:   Marland Kitchen Physically Abused:   . Sexually Abused:     Family History  Problem  Relation Age of Onset  . Heart disease Other   . Cancer Other   . Arthritis Other   . Diabetes Other   . Stroke Maternal Grandmother   . Cancer Father   . Crohn's disease Mother   . Cerebral palsy Brother   . ADD / ADHD Daughter     Temp (!) 97.1 F (36.2 C)   Ht 5\' 4"  (1.626 m)   Wt 195 lb (88.5 kg)   BMI 33.47 kg/m   Body mass index is 33.47 kg/m.     Objective:   Physical Exam Vitals and nursing note reviewed.  Constitutional:      Appearance: She is well-developed.  HENT:     Head: Normocephalic and atraumatic.  Eyes:     Conjunctiva/sclera: Conjunctivae normal.     Pupils: Pupils are equal, round, and reactive to light.  Cardiovascular:     Rate and Rhythm: Normal rate and regular rhythm.  Pulmonary:     Effort: Pulmonary effort is normal.  Abdominal:     Palpations: Abdomen is soft.  Musculoskeletal:     Cervical back: Normal range of motion and neck supple.       Feet:  Skin:    General: Skin is warm and dry.  Neurological:     Mental Status: She is alert and oriented to person, place, and time.     Cranial Nerves: No cranial nerve deficit.     Motor: No abnormal muscle tone.     Coordination: Coordination normal.     Deep Tendon Reflexes: Reflexes are normal and symmetric. Reflexes normal.  Psychiatric:        Behavior: Behavior normal.        Thought Content: Thought content normal.        Judgment: Judgment normal.    X-rays were done of the left ankle, reported separately.  Negative.       Assessment & Plan:   Encounter Diagnosis  Name Primary?  . Pain in left ankle and joints of left foot Yes   I have given ankle brace.  Use rubs to the ankle.  Continue the ibuprofen as needed.  Ice, elevate.  Return in two weeks.  Call if any problem.  Precautions discussed.   Electronically Signed Sanjuana Kava, MD 3/23/202110:09 AM

## 2019-08-04 ENCOUNTER — Ambulatory Visit: Payer: PPO | Admitting: Orthopaedic Surgery

## 2019-08-04 ENCOUNTER — Other Ambulatory Visit: Payer: Self-pay

## 2019-08-04 ENCOUNTER — Encounter: Payer: Self-pay | Admitting: Orthopaedic Surgery

## 2019-08-04 VITALS — Ht 64.0 in | Wt 189.0 lb

## 2019-08-04 DIAGNOSIS — M25572 Pain in left ankle and joints of left foot: Secondary | ICD-10-CM | POA: Diagnosis not present

## 2019-08-04 NOTE — Progress Notes (Signed)
Patient OI:[id number] Sara Vance, female DOB:Dec 10, 1975, 44 y.o. [id number]  Chief Complaint  Patient presents with  . Ankle Pain    Left Ankle    HPI  Sara Vance is a 44 y.o. female who had left ankle injury  She is using a post op shoe and ankle brace on the left.  She is improving.  She has much less pain and no swelling now.  I told her to stop the post op shoe, go to regular shoe.  Continue the ankle brace and then slowly come out of it.  I will see her in two weeks.   Body mass index is 32.44 kg/m.  ROS  Review of Systems  Constitutional: Positive for activity change.  Musculoskeletal: Positive for arthralgias, gait problem and joint swelling.  Psychiatric/Behavioral: The patient is nervous/anxious.   All other systems reviewed and are negative.   All other systems reviewed and are negative.  The following is a summary of the past history medically, past history surgically, known current medicines, social history and family history.  This information is gathered electronically by the computer from prior information and documentation.  I review this each visit and have found including this information at this point in the chart is beneficial and informative.    Past Medical History:  Diagnosis Date  . Anxiety   . Bipolar 1 disorder ([place])   . IBS (irritable bowel syndrome)     Past Surgical History:  Procedure Laterality Date  . CHOLECYSTECTOMY    . ENDOMETRIAL ABLATION      Family History  Problem Relation Age of Onset  . Heart disease Other   . Cancer Other   . Arthritis Other   . Diabetes Other   . Stroke Maternal Grandmother   . Cancer Father   . Crohn's disease Mother   . Cerebral palsy Brother   . ADD / ADHD Daughter     Social History Social History   Tobacco Use  . Smoking status: Never Smoker  . Smokeless tobacco: Never Used  Substance Use Topics  . Alcohol use: No  . Drug use: No    Allergies  Allergen Reactions  . Latex Itching     Bleeding, Redness    Current Outpatient Medications  Medication Sig Dispense Refill  . acetaminophen (TYLENOL) 500 MG tablet Take 500 mg by mouth every 6 (six) hours as needed. For headache    . amphetamine-dextroamphetamine (ADDERALL) 20 MG tablet Take 20 mg by mouth daily.      . ARIPiprazole (ABILIFY) 10 MG tablet Take 10 mg by mouth daily.    Marland Kitchen buPROPion (WELLBUTRIN XL) 150 MG 24 hr tablet Take 450 mg by mouth daily.      . clonazePAM (KLONOPIN) 1 MG tablet Take 1 mg by mouth at bedtime.    . gabapentin (NEURONTIN) 800 MG tablet Take 1,200 mg by mouth 2 (two) times daily. 400 in the mornings 800 at bedtime    . ibuprofen (ADVIL,MOTRIN) 200 MG tablet Take 400 mg by mouth every 6 (six) hours as needed. For pain    . nystatin (MYCOSTATIN/NYSTOP) powder Apply topically 3 (three) times daily. 60 g 5  . perphenazine (TRILAFON) 16 MG tablet Take 16 mg by mouth daily.     . traZODone (DESYREL) 150 MG tablet Take 300 mg by mouth at bedtime.      No current facility-administered medications for this visit.     Physical Exam  Height 5\' 4"  (1.626 m), weight 189 lb (  85.7 kg).  Constitutional: overall normal hygiene, normal nutrition, well developed, normal grooming, normal body habitus. Assistive device:post op shoe and ankle brace left  Musculoskeletal: gait and station Limp left, muscle tone and strength are normal, no tremors or atrophy is present.  .  Neurological: coordination overall normal.  Deep tendon reflex/nerve stretch intact.  Sensation normal.  Cranial nerves II-XII intact.   Skin:   Normal overall no scars, lesions, ulcers or rashes. No psoriasis.  Psychiatric: Alert and oriented x 3.  Recent memory intact, remote memory unclear.  Normal mood and affect. Well groomed.  Good eye contact.  Cardiovascular: overall no swelling, no varicosities, no edema bilaterally, normal temperatures of the legs and arms, no clubbing, cyanosis and good capillary refill.  Lymphatic:  palpation is normal.  Left ankle has full ROM and no effusion but is tender.  Limp left.  NV intact.  All other systems reviewed and are negative   The patient has been educated about the nature of the problem(s) and counseled on treatment options.  The patient appeared to understand what I have discussed and is in agreement with it.  Encounter Diagnosis  Name Primary?  . Pain in left ankle and joints of left foot Yes    PLAN Call if any problems.  Precautions discussed.  Continue current medications.   Return to clinic 2 weeks   Electronically Signed Sanjuana Kava, MD 4/6/20212:24 PM

## 2019-08-18 ENCOUNTER — Other Ambulatory Visit: Payer: Self-pay

## 2019-08-18 ENCOUNTER — Encounter: Payer: Self-pay | Admitting: Orthopaedic Surgery

## 2019-08-18 ENCOUNTER — Ambulatory Visit: Payer: PPO | Admitting: Orthopaedic Surgery

## 2019-08-18 VITALS — BP 125/80 | HR 88 | Ht 64.0 in | Wt 185.0 lb

## 2019-08-18 DIAGNOSIS — M25572 Pain in left ankle and joints of left foot: Secondary | ICD-10-CM | POA: Diagnosis not present

## 2019-08-18 NOTE — Progress Notes (Signed)
Patient Sara Vance, female DOB:04-22-1976, 44 y.o. [id number]  Chief Complaint  Patient presents with  . Ankle Pain    left    HPI  Sara Vance is a 44 y.o. female who has continued pain of the left ankle.  She needs to wear her brace and that helps a lot.  She has some lateral swelling still.  She has no new trauma, no redness.   Body mass index is 31.76 kg/m.  ROS  Review of Systems  Constitutional: Positive for activity change.  Musculoskeletal: Positive for arthralgias, gait problem and joint swelling.  Psychiatric/Behavioral: The patient is nervous/anxious.   All other systems reviewed and are negative.   All other systems reviewed and are negative.  The following is a summary of the past history medically, past history surgically, known current medicines, social history and family history.  This information is gathered electronically by the computer from prior information and documentation.  I review this each visit and have found including this information at this point in the chart is beneficial and informative.    Past Medical History:  Diagnosis Date  . Anxiety   . Bipolar 1 disorder ([place])   . IBS (irritable bowel syndrome)     Past Surgical History:  Procedure Laterality Date  . CHOLECYSTECTOMY    . ENDOMETRIAL ABLATION      Family History  Problem Relation Age of Onset  . Heart disease Other   . Cancer Other   . Arthritis Other   . Diabetes Other   . Stroke Maternal Grandmother   . Cancer Father   . Crohn's disease Mother   . Cerebral palsy Brother   . ADD / ADHD Daughter     Social History Social History   Tobacco Use  . Smoking status: Never Smoker  . Smokeless tobacco: Never Used  Substance Use Topics  . Alcohol use: No  . Drug use: No    Allergies  Allergen Reactions  . Latex Itching    Bleeding, Redness    Current Outpatient Medications  Medication Sig Dispense Refill  . acetaminophen (TYLENOL) 500 MG tablet Take  500 mg by mouth every 6 (six) hours as needed. For headache    . amphetamine-dextroamphetamine (ADDERALL) 20 MG tablet Take 20 mg by mouth daily.      . ARIPiprazole (ABILIFY) 10 MG tablet Take 10 mg by mouth daily.    Marland Kitchen buPROPion (WELLBUTRIN XL) 150 MG 24 hr tablet Take 450 mg by mouth daily.      . clonazePAM (KLONOPIN) 1 MG tablet Take 1 mg by mouth at bedtime.    . gabapentin (NEURONTIN) 800 MG tablet Take 1,200 mg by mouth 2 (two) times daily. 400 in the mornings 800 at bedtime    . ibuprofen (ADVIL,MOTRIN) 200 MG tablet Take 400 mg by mouth every 6 (six) hours as needed. For pain    . nystatin (MYCOSTATIN/NYSTOP) powder Apply topically 3 (three) times daily. 60 g 5  . perphenazine (TRILAFON) 16 MG tablet Take 16 mg by mouth daily.     . traZODone (DESYREL) 150 MG tablet Take 300 mg by mouth at bedtime.      No current facility-administered medications for this visit.     Physical Exam  Blood pressure 125/80, pulse 88, height 5\' 4"  (1.626 m), weight 185 lb (83.9 kg).  Constitutional: overall normal hygiene, normal nutrition, well developed, normal grooming, normal body habitus. Assistive device:left ankle brace  Musculoskeletal: gait and station Limp left ,  muscle tone and strength are normal, no tremors or atrophy is present.  .  Neurological: coordination overall normal.  Deep tendon reflex/nerve stretch intact.  Sensation normal.  Cranial nerves II-XII intact.   Skin:   Normal overall no scars, lesions, ulcers or rashes. No psoriasis.  Psychiatric: Alert and oriented x 3.  Recent memory intact, remote memory unclear.  Normal mood and affect. Well groomed.  Good eye contact.  Cardiovascular: overall no swelling, no varicosities, no edema bilaterally, normal temperatures of the legs and arms, no clubbing, cyanosis and good capillary refill.  Lymphatic: palpation is normal.  Left ankle has good ROM and tender over anterior talofibular ligament.  Slight swelling.  NV  intact.  All other systems reviewed and are negative   The patient has been educated about the nature of the problem(s) and counseled on treatment options.  The patient appeared to understand what I have discussed and is in agreement with it.  Encounter Diagnosis  Name Primary?  . Pain in left ankle and joints of left foot Yes    PLAN Call if any problems.  Precautions discussed.  Continue current medications.   Return to clinic 3 weeks   Begin PT.  Electronically Signed Sanjuana Kava, MD 4/20/202110:46 AM

## 2019-08-20 ENCOUNTER — Ambulatory Visit (HOSPITAL_COMMUNITY): Payer: PPO | Attending: Orthopaedic Surgery | Admitting: Physical Therapy

## 2019-08-20 ENCOUNTER — Other Ambulatory Visit: Payer: Self-pay

## 2019-08-20 ENCOUNTER — Encounter (HOSPITAL_COMMUNITY): Payer: Self-pay | Admitting: Physical Therapy

## 2019-08-20 DIAGNOSIS — R2689 Other abnormalities of gait and mobility: Secondary | ICD-10-CM | POA: Diagnosis not present

## 2019-08-20 DIAGNOSIS — M25572 Pain in left ankle and joints of left foot: Secondary | ICD-10-CM | POA: Insufficient documentation

## 2019-08-20 NOTE — Therapy (Signed)
[facility] [address] [place], [place], [address] Phone: [phone]   Fax:  [phone]  Physical Therapy Evaluation  Patient Details  Name: Sara Vance MRN: [id number] Date of Birth: 01/27/1976 Referring Provider (PT): Sanjuana Kava MD   Encounter Date: 08/20/2019  PT End of Session - 08/20/19 1732    Visit Number  1    Number of Visits  12    Date for PT Re-Evaluation  10/02/19    Authorization Type  [employer], no V.L. no auth    Progress Note Due on Visit  10    PT Start Time  1640    PT Stop Time  1730    PT Time Calculation (min)  50 min    Activity Tolerance  Patient tolerated treatment well    Behavior During Therapy  [facility] for tasks assessed/performed       Past Medical History:  Diagnosis Date  . Anxiety   . Bipolar 1 disorder ([place])   . IBS (irritable bowel syndrome)     Past Surgical History:  Procedure Laterality Date  . CHOLECYSTECTOMY    . ENDOMETRIAL ABLATION      There were no vitals filed for this visit.   Subjective Assessment - 08/20/19 1651    Subjective  Patient presents to physical therapy with complaint of LT ankle pain. Patient says about 4 weeks ago she ran outside in the rain chasing her daughter when she slipped and flee. Says her Lt ankle folded underneath her leg. Says she went to her MD the following week and had xray, no fractures, was diagnosed with ankle sprain and given ankle brace. Patient denies prescribed mediation but says she takes ibuprofen daily which has been helping. Also notes she has been icing, and elevating and using liniment rub that helps as well.    Limitations  Lifting;Standing;Walking;House hold activities    How long can you stand comfortably?  5 min    How long can you walk comfortably?  <5 min    Diagnostic tests  xray    Patient Stated Goals  Want ankle not to hurt no more    Currently in Pain?  No/denies   no pain non WB, only pain with standing,  walking, movement        [facility] PT Assessment - 08/20/19 0001      Assessment   Medical Diagnosis  LT ankle sprain    Referring Provider (PT)  Sanjuana Kava MD    Onset Date/Surgical Date  07/17/19    Next MD Visit  09/08/19    Prior Therapy  Not for ankle       Precautions   Precautions  None    Required Braces or Orthoses  Other Brace/Splint   LT ankle stability brace      Restrictions   Weight Bearing Restrictions  No      Balance Screen   Has the patient fallen in the past 6 months  Yes    How many times?  1    Has the patient had a decrease in activity level because of a fear of falling?   No    Is the patient reluctant to leave their home because of a fear of falling?   No      Home Environment   Living Environment  Private residence    Living Arrangements  Parent;Spouse/significant other;Children    Available Help at Discharge  Family      Prior  Function   Level of Independence  Independent      Cognition   Overall Cognitive Status  Within Functional Limits for tasks assessed      Observation/Other Assessments   Focus on Therapeutic Outcomes (FOTO)   47% limited       Observation/Other Assessments-Edema    Edema  --   no notable edema     Sensation   Light Touch  Appears Intact      ROM / Strength   AROM / PROM / Strength  AROM;Strength      AROM   AROM Assessment Site  Ankle    Right/Left Ankle  Right;Left    Right Ankle Dorsiflexion  10    Right Ankle Plantar Flexion  55    Right Ankle Inversion  35    Right Ankle Eversion  5    Left Ankle Dorsiflexion  7    Left Ankle Plantar Flexion  50   pain   Left Ankle Inversion  30   discomfort   Left Ankle Eversion  5   pain     Strength   Strength Assessment Site  Hip;Knee;Ankle    Right/Left Hip  Right;Left    Right Hip Flexion  5/5    Right Hip Extension  4+/5    Right Hip ABduction  4+/5    Left Hip Flexion  5/5    Left Hip Extension  4+/5    Left Hip ABduction  4+/5    Right/Left Knee   Right;Left    Right Knee Flexion  5/5    Right Knee Extension  5/5    Left Knee Flexion  5/5    Left Knee Extension  5/5    Right/Left Ankle  Right;Left    Left Ankle Dorsiflexion  4+/5    Left Ankle Plantar Flexion  4+/5   discomfort   Left Ankle Inversion  4/5   pain   Left Ankle Eversion  4/5   pain     Flexibility   Soft Tissue Assessment /Muscle Length  --   mod restriciton in LT gastroc flexibility      Palpation   Palpation comment  Mod tenderness to palpation about anterior LT ankle joint, and inferior to medial malleolus       Ambulation/Gait   Ambulation/Gait  Yes    Ambulation/Gait Assistance  7: Independent    Assistive device  None    Gait Pattern  Decreased stance time - left;Decreased stride length;Antalgic    Ambulation Surface  Level;Indoor      Balance   Balance Assessed  Yes      Static Standing Balance   Static Standing Balance -  Activities   Single Leg Stance - Right Leg;Single Leg Stance - Left Leg    Static Standing - Comment/# of Minutes  20 sec; unable                Objective measurements completed on examination: See above findings.      [facility] Adult PT Treatment/Exercise - 08/20/19 0001      Exercises   Exercises  Ankle      Ankle Exercises: Seated   ABC's  1 rep    Ankle Circles/Pumps  Left;10 reps    Other Seated Ankle Exercises  ankle PF with RTB x 10             PT Education - 08/20/19 1652    Education Details  on evaluation findings, POC and HEP  Person(s) Educated  Patient    Methods  Explanation;Handout    Comprehension  Verbalized understanding       PT Short Term Goals - 08/20/19 1740      PT SHORT TERM GOAL #1   Title  Patient will be independent with initial HEP to improve functional outcomes    Time  3    Period  Weeks    Status  New    Target Date  09/11/19      PT SHORT TERM GOAL #2   Title  Patient will report at least 50% overall improvement in subjective complaint to indicate  improvement in ability to perform ADLs.    Time  3    Period  Weeks    Status  New    Target Date  09/11/19        PT Long Term Goals - 08/20/19 1740      PT LONG TERM GOAL #1   Title  Patient will improve FOTO score to <30% to indicate improvement in functional outcomes    Time  6    Period  Weeks    Status  New    Target Date  10/02/19      PT LONG TERM GOAL #2   Title  Patient will be able to maintain single limb stance >30 seconds on BLEs to improve stability and reduce risk for falls    Time  6    Period  Weeks    Status  New    Target Date  10/02/19      PT LONG TERM GOAL #3   Title  Patient will have equal to or > 4+/5 MMT throughout LT ankle to improve ability to perform functional mobility, stair ambulation and ADLs.    Time  6    Period  Weeks    Status  New    Target Date  10/02/19      PT LONG TERM GOAL #4   Title  Patient will report at least 75% overall improvement in subjective complaint to indicate improvement in ability to perform ADLs.    Time  6    Period  Weeks    Status  New    Target Date  10/02/19             Plan - 08/20/19 1737    Clinical Impression Statement  Patient is a 44 y.o. female who presents to physical therapy with complaint of LT ankle pain. Patient demonstrates decreased strength, ROM restriction, balance deficits and gait abnormalities which are likely contributing to symptoms of pain and are negatively impacting patient ability to perform ADLs and functional mobility tasks. Patient will benefit from skilled physical therapy services to address these deficits to reduce pain, improve level of function with ADLs, functional mobility tasks, and reduce risk for falls.    Examination-Activity Limitations  Lift;Stand;Locomotion Level;Bend;Transfers;Squat;Stairs;Hygiene/Grooming;Dressing    Examination-Participation Restrictions  Yard Work;Community Activity;Laundry;Volunteer;Cleaning    Stability/Clinical Decision Making   Stable/Uncomplicated    Clinical Decision Making  Low    Rehab Potential  Good    PT Frequency  2x / week    PT Duration  6 weeks    PT Treatment/Interventions  ADLs/Self Care Home Management;Aquatic Therapy;Biofeedback;Cryotherapy;Electrical Stimulation;Moist Heat;Traction;Parrafin;Fluidtherapy;Contrast Bath;Ultrasound;Therapeutic exercise;Orthotic Fit/Training;Compression bandaging;Manual lymph drainage;Patient/family education;Therapeutic activities;Manual techniques;Functional mobility training;Stair training;Neuromuscular re-education;Balance training;DME Instruction;Gait training;Iontophoresis 4mg /ml Dexamethasone;Passive range of motion;Dry needling;Energy conservation;Spinal Manipulations;Splinting;Joint Manipulations;Vasopneumatic Device;Taping    PT Next Visit Plan  Review goals and HEP. Progress ankle stabilization and LLE  strengthening as tolerated. Add ankle band 4 way (inv/ ev/ pf/ df) and BAPs board next visit. Progress to standing balance as able    PT Home Exercise Plan  08/20/19: ankle PF with RTB, ankle circles, ankle ABCs    Consulted and Agree with Plan of Care  Patient       Patient will benefit from skilled therapeutic intervention in order to improve the following deficits and impairments:  Abnormal gait, Pain, Decreased mobility, Decreased activity tolerance, Decreased range of motion, Decreased strength, Hypomobility, Decreased balance, Difficulty walking, Impaired flexibility  Visit Diagnosis: Pain in left ankle and joints of left foot  Other abnormalities of gait and mobility     Problem List Patient Active Problem List   Diagnosis Date Noted  . Foot pain, left 07/20/2019  . Incontinence overflow, stress female 12/29/2018  . Irritable bowel syndrome 12/29/2018  . Fx lateral malleolus-closed 05/07/2012    5:45 PM, 08/20/19 Josue Hector PT DPT  Physical Therapist with [facility]  [phone]   [facility] [address] [place], [place], [address] Phone: [phone]   Fax:  [phone]  Name: Marynell R Weygandt MRN: [id number] Date of Birth: 03-31-1976

## 2019-08-20 NOTE — Patient Instructions (Signed)
Access Code: [email or web address] URL: [email or web address] Date: 08/20/2019 Prepared by: Josue Hector  Exercises Seated Ankle Circles - 3 x daily - 7 x weekly - 2 sets - 10 reps Seated Ankle Alphabet - 3 x daily - 7 x weekly - 1 sets - 1 reps Seated Ankle Plantar Flexion with Resistance Loop - 3 x daily - 7 x weekly - 2 sets - 10 reps

## 2019-08-25 ENCOUNTER — Other Ambulatory Visit: Payer: Self-pay

## 2019-08-25 ENCOUNTER — Ambulatory Visit (HOSPITAL_COMMUNITY): Payer: PPO

## 2019-08-25 ENCOUNTER — Encounter (HOSPITAL_COMMUNITY): Payer: Self-pay

## 2019-08-25 DIAGNOSIS — M25572 Pain in left ankle and joints of left foot: Secondary | ICD-10-CM | POA: Diagnosis not present

## 2019-08-25 DIAGNOSIS — R2689 Other abnormalities of gait and mobility: Secondary | ICD-10-CM

## 2019-08-25 NOTE — Therapy (Signed)
[facility] [address] [place], [place], [address] Phone: [phone]   Fax:  [phone]  Physical Therapy Treatment  Patient Details  Name: Sara Vance MRN: [id number] Date of Birth: 08/18/1975 Referring Provider (PT): Sanjuana Kava MD   Encounter Date: 08/25/2019  PT End of Session - 08/25/19 1316    Visit Number  2    Number of Visits  12    Date for PT Re-Evaluation  10/02/19    Authorization Type  [employer], no V.L. no auth    Progress Note Due on Visit  10    PT Start Time  1310    PT Stop Time  1350    PT Time Calculation (min)  40 min    Activity Tolerance  Patient tolerated treatment well    Behavior During Therapy  [facility] for tasks assessed/performed       Past Medical History:  Diagnosis Date  . Anxiety   . Bipolar 1 disorder ([place])   . IBS (irritable bowel syndrome)     Past Surgical History:  Procedure Laterality Date  . CHOLECYSTECTOMY    . ENDOMETRIAL ABLATION      There were no vitals filed for this visit.  Subjective Assessment - 08/25/19 1313    Subjective  Pt stated she has been compliant with theraband exercises, ice and elevating.  Reports ability to walk for 5 minutes in yard with brace on, no reports of pain.    Patient Stated Goals  Want ankle not to hurt no more    Currently in Pain?  Yes    Pain Score  1     Pain Location  Ankle    Pain Orientation  Left    Pain Descriptors / Indicators  Sore    Pain Type  Acute pain    Pain Onset  1 to 4 weeks ago    Pain Frequency  Intermittent    Aggravating Factors   standing    Pain Relieving Factors  horselinament, ice and elevation    Effect of Pain on Daily Activities  limits                       [facility] Adult PT Treatment/Exercise - 08/25/19 0001      Exercises   Exercises  Ankle      Ankle Exercises: Stretches   Slant Board Stretch  3 reps;30 seconds    Slant Board Stretch Limitations  slant board      Ankle  Exercises: Standing   Heel Raises  10 reps;Both      Ankle Exercises: Seated   ABC's  1 rep    ABC's Limitations  reviewed HEP    Ankle Circles/Pumps  Left;10 reps    Ankle Circles/Pumps Limitations  reviewed HEP    BAPS  Level 2;Sitting;10 reps    BAPS Limitations  DF/PF; Inv/Ev; CW/CCW      Ankle Exercises: Supine   T-Band  RTB 15x 4 way directins             PT Education - 08/25/19 1318    Education Details  Reviewed goals, assured compliance with HEP and assured correct form/mechanics, reviewed RICE techniques for pain and edema control.    Person(s) Educated  Patient    Methods  Explanation    Comprehension  Verbalized understanding       PT Short Term Goals - 08/20/19 1740      PT SHORT TERM  GOAL #1   Title  Patient will be independent with initial HEP to improve functional outcomes    Time  3    Period  Weeks    Status  New    Target Date  09/11/19      PT SHORT TERM GOAL #2   Title  Patient will report at least 50% overall improvement in subjective complaint to indicate improvement in ability to perform ADLs.    Time  3    Period  Weeks    Status  New    Target Date  09/11/19        PT Long Term Goals - 08/20/19 1740      PT LONG TERM GOAL #1   Title  Patient will improve FOTO score to <30% to indicate improvement in functional outcomes    Time  6    Period  Weeks    Status  New    Target Date  10/02/19      PT LONG TERM GOAL #2   Title  Patient will be able to maintain single limb stance >30 seconds on BLEs to improve stability and reduce risk for falls    Time  6    Period  Weeks    Status  New    Target Date  10/02/19      PT LONG TERM GOAL #3   Title  Patient will have equal to or > 4+/5 MMT throughout LT ankle to improve ability to perform functional mobility, stair ambulation and ADLs.    Time  6    Period  Weeks    Status  New    Target Date  10/02/19      PT LONG TERM GOAL #4   Title  Patient will report at least 75% overall  improvement in subjective complaint to indicate improvement in ability to perform ADLs.    Time  6    Period  Weeks    Status  New    Target Date  10/02/19            Plan - 08/25/19 1335    Clinical Impression Statement  Reviewed goals, educated importance of HEP compliance and assured correct form/mechanics.  Pt able to recall and demonstrate appropriate exercises with no cueing required.  Session foucs on ankle strengthening and mobility.  Pt able to complete good form wiht min cueing to reduce compensation of knee or hip to maximize ankle mobility with BAPS board.  Pt given additional HEP printout with DF, Inv, Ev movements, able to demonstrate appropriate form.    Examination-Activity Limitations  Lift;Stand;Locomotion Level;Bend;Transfers;Squat;Stairs;Hygiene/Grooming;Dressing    Examination-Participation Restrictions  Yard Work;Community Activity;Laundry;Volunteer;Cleaning    Stability/Clinical Decision Making  Stable/Uncomplicated    Clinical Decision Making  Low    Rehab Potential  Good    PT Frequency  2x / week    PT Duration  6 weeks    PT Treatment/Interventions  ADLs/Self Care Home Management;Aquatic Therapy;Biofeedback;Cryotherapy;Electrical Stimulation;Moist Heat;Traction;Parrafin;Fluidtherapy;Contrast Bath;Ultrasound;Therapeutic exercise;Orthotic Fit/Training;Compression bandaging;Manual lymph drainage;Patient/family education;Therapeutic activities;Manual techniques;Functional mobility training;Stair training;Neuromuscular re-education;Balance training;DME Instruction;Gait training;Iontophoresis 4mg /ml Dexamethasone;Passive range of motion;Dry needling;Energy conservation;Spinal Manipulations;Splinting;Joint Manipulations;Vasopneumatic Device;Taping    PT Next Visit Plan  Progress ankle stabilization and LLE strengthening as tolerated. BAPs board L3 in seated next visit. Progress to standing balance as able    PT Home Exercise Plan  08/20/19: ankle PF with RTB, ankle circles,  ankle ABCs; 4/27:RTB DF, Inv/Ev, heel raise       Patient will benefit from skilled  therapeutic intervention in order to improve the following deficits and impairments:  Abnormal gait, Pain, Decreased mobility, Decreased activity tolerance, Decreased range of motion, Decreased strength, Hypomobility, Decreased balance, Difficulty walking, Impaired flexibility  Visit Diagnosis: Pain in left ankle and joints of left foot  Other abnormalities of gait and mobility     Problem List Patient Active Problem List   Diagnosis Date Noted  . Foot pain, left 07/20/2019  . Incontinence overflow, stress female 12/29/2018  . Irritable bowel syndrome 12/29/2018  . Fx lateral malleolus-closed 05/07/2012   Ihor Austin, LPTA/CLT; CBIS [phone]  Aldona Lento 08/25/2019, 2:06 PM  [facility] [address] [place], [place], [address] Phone: [phone]   Fax:  [phone]  Name: Sara Vance MRN: [id number] Date of Birth: 1975-05-31

## 2019-08-26 DIAGNOSIS — F251 Schizoaffective disorder, depressive type: Secondary | ICD-10-CM | POA: Diagnosis not present

## 2019-08-26 DIAGNOSIS — F900 Attention-deficit hyperactivity disorder, predominantly inattentive type: Secondary | ICD-10-CM | POA: Diagnosis not present

## 2019-08-27 ENCOUNTER — Telehealth (HOSPITAL_COMMUNITY): Payer: Self-pay | Admitting: Physical Therapy

## 2019-08-27 ENCOUNTER — Ambulatory Visit (HOSPITAL_COMMUNITY): Payer: PPO | Admitting: Physical Therapy

## 2019-08-27 NOTE — Telephone Encounter (Signed)
Called regarding patient not showing for appointment this date. Patient's mother answered and stated the patient had not been feeling well, but gave no further explanation. Reminded of next scheduled appointment.   Clarene Critchley PT, DPT 2:45 PM, 08/27/19 [phone]

## 2019-09-01 ENCOUNTER — Other Ambulatory Visit (HOSPITAL_COMMUNITY): Payer: Self-pay | Admitting: Obstetrics & Gynecology

## 2019-09-01 ENCOUNTER — Ambulatory Visit (INDEPENDENT_AMBULATORY_CARE_PROVIDER_SITE_OTHER): Payer: PPO | Admitting: Obstetrics & Gynecology

## 2019-09-01 ENCOUNTER — Encounter (HOSPITAL_COMMUNITY): Payer: PPO

## 2019-09-01 ENCOUNTER — Other Ambulatory Visit: Payer: Self-pay

## 2019-09-01 ENCOUNTER — Other Ambulatory Visit: Payer: PPO | Admitting: Obstetrics & Gynecology

## 2019-09-01 ENCOUNTER — Encounter: Payer: Self-pay | Admitting: Obstetrics & Gynecology

## 2019-09-01 ENCOUNTER — Other Ambulatory Visit (HOSPITAL_COMMUNITY)
Admission: RE | Admit: 2019-09-01 | Discharge: 2019-09-01 | Disposition: A | Discharge status: Home / Self Care | Payer: PPO | Source: Ambulatory Visit | Attending: Obstetrics & Gynecology | Admitting: Obstetrics & Gynecology

## 2019-09-01 VITALS — BP 114/78 | HR 82 | Ht 64.0 in | Wt 184.0 lb

## 2019-09-01 DIAGNOSIS — Z1151 Encounter for screening for human papillomavirus (HPV): Secondary | ICD-10-CM | POA: Diagnosis not present

## 2019-09-01 DIAGNOSIS — Z01419 Encounter for gynecological examination (general) (routine) without abnormal findings: Secondary | ICD-10-CM | POA: Insufficient documentation

## 2019-09-01 DIAGNOSIS — Z1231 Encounter for screening mammogram for malignant neoplasm of breast: Secondary | ICD-10-CM

## 2019-09-01 NOTE — Progress Notes (Signed)
Subjective:     Sara Vance is a 44 y.o. female here for a routine exam.  Patient's last menstrual period was 08/26/2019 (exact date). G1P1 Birth Control Method:  abstinence Menstrual Calendar(currently): ablation   Current complaints: back pain from lifting brother, constipation.   Current acute medical issues:     Recent Gynecologic History Patient's last menstrual period was 08/26/2019 (exact date). Last Pap: 2019,  normal Last mammogram: 10/19,  normal  Past Medical History:  Diagnosis Date  . Anxiety   . Bipolar 1 disorder ([place])   . IBS (irritable bowel syndrome)     Past Surgical History:  Procedure Laterality Date  . CHOLECYSTECTOMY    . ENDOMETRIAL ABLATION      OB History    Gravida  1   Para  1   Term      Preterm      AB      Living  1     SAB      TAB      Ectopic      Multiple      Live Births              Social History   Socioeconomic History  . Marital status: Divorced    Spouse name: Not on file  . Number of children: Not on file  . Years of education: Not on file  . Highest education level: Not on file  Occupational History  . Not on file  Tobacco Use  . Smoking status: Never Smoker  . Smokeless tobacco: Never Used  Substance and Sexual Activity  . Alcohol use: No  . Drug use: No  . Sexual activity: Not Currently    Birth control/protection: None  Other Topics Concern  . Not on file  Social History Narrative  . Not on file   Social Determinants of Health   Financial Resource Strain: Low Risk   . Difficulty of Paying Living Expenses: Not hard at all  Food Insecurity: No Food Insecurity  . Worried About Charity fundraiser in the Last Year: Never true  . Ran Out of Food in the Last Year: Never true  Transportation Needs: No Transportation Needs  . Lack of Transportation (Medical): No  . Lack of Transportation (Non-Medical): No  Physical Activity: Inactive  . Days of Exercise per Week: 0 days  . Minutes of  Exercise per Session: 10 min  Stress:   . Feeling of Stress :   Social Connections: Somewhat Isolated  . Frequency of Communication with Friends and Family: Twice a week  . Frequency of Social Gatherings with Friends and Family: Once a week  . Attends Religious Services: More than 4 times per year  . Active Member of Clubs or Organizations: No  . Attends Archivist Meetings: Never  . Marital Status: Divorced    Family History  Problem Relation Age of Onset  . Heart disease Other   . Cancer Other   . Arthritis Other   . Diabetes Other   . Stroke Maternal Grandmother   . Cancer Father   . Crohn's disease Mother   . Cerebral palsy Brother   . ADD / ADHD Daughter      Current Outpatient Medications:  .  acetaminophen (TYLENOL) 500 MG tablet, Take 500 mg by mouth every 6 (six) hours as needed. For headache, Disp: , Rfl:  .  amphetamine-dextroamphetamine (ADDERALL) 20 MG tablet, Take 20 mg by mouth daily.  , Disp: ,  Rfl:  .  ARIPiprazole (ABILIFY) 10 MG tablet, Take 10 mg by mouth daily., Disp: , Rfl:  .  buPROPion (WELLBUTRIN XL) 150 MG 24 hr tablet, Take 450 mg by mouth daily.  , Disp: , Rfl:  .  clonazePAM (KLONOPIN) 1 MG tablet, Take 1 mg by mouth at bedtime., Disp: , Rfl:  .  gabapentin (NEURONTIN) 800 MG tablet, Take 1,200 mg by mouth 2 (two) times daily. 400 in the mornings 800 at bedtime, Disp: , Rfl:  .  ibuprofen (ADVIL,MOTRIN) 200 MG tablet, Take 400 mg by mouth every 6 (six) hours as needed. For pain, Disp: , Rfl:  .  perphenazine (TRILAFON) 16 MG tablet, Take 16 mg by mouth daily. , Disp: , Rfl:  .  traZODone (DESYREL) 150 MG tablet, Take 300 mg by mouth at bedtime. , Disp: , Rfl:   Review of Systems  Review of Systems  Constitutional: Negative for fever, chills, weight loss, malaise/fatigue and diaphoresis.  HENT: Negative for hearing loss, ear pain, nosebleeds, congestion, sore throat, neck pain, tinnitus and ear discharge.   Eyes: Negative for blurred  vision, double vision, photophobia, pain, discharge and redness.  Respiratory: Negative for cough, hemoptysis, sputum production, shortness of breath, wheezing and stridor.   Cardiovascular: Negative for chest pain, palpitations, orthopnea, claudication, leg swelling and PND.  Gastrointestinal: negative for abdominal pain. Negative for heartburn, nausea, vomiting, diarrhea, constipation, blood in stool and melena.  Genitourinary: Negative for dysuria, urgency, frequency, hematuria and flank pain.  Musculoskeletal: Negative for myalgias, back pain, joint pain and falls.  Skin: Negative for itching and rash.  Neurological: Negative for dizziness, tingling, tremors, sensory change, speech change, focal weakness, seizures, loss of consciousness, weakness and headaches.  Endo/Heme/Allergies: Negative for environmental allergies and polydipsia. Does not bruise/bleed easily.  Psychiatric/Behavioral: Negative for depression, suicidal ideas, hallucinations, memory loss and substance abuse. The patient is not nervous/anxious and does not have insomnia.        Objective:  Blood pressure 114/78, pulse 82, height 5\' 4"  (1.626 m), weight 184 lb (83.5 kg), last menstrual period 08/26/2019.   Physical Exam  Vitals reviewed. Constitutional: She is oriented to person, place, and time. She appears well-developed and well-nourished.  HENT:  Head: Normocephalic and atraumatic.        Right Ear: External ear normal.  Left Ear: External ear normal.  Nose: Nose normal.  Mouth/Throat: Oropharynx is clear and moist.  Eyes: Conjunctivae and EOM are normal. Pupils are equal, round, and reactive to light. Right eye exhibits no discharge. Left eye exhibits no discharge. No scleral icterus.  Neck: Normal range of motion. Neck supple. No tracheal deviation present. No thyromegaly present.  Cardiovascular: Normal rate, regular rhythm, normal heart sounds and intact distal pulses.  Exam reveals no gallop and no friction  rub.   No murmur heard. Respiratory: Effort normal and breath sounds normal. No respiratory distress. She has no wheezes. She has no rales. She exhibits no tenderness.  GI: Soft. Bowel sounds are normal. She exhibits no distension and no mass. There is no tenderness. There is no rebound and no guarding.  Genitourinary:  Breasts no masses skin changes or nipple changes bilaterally      Vulva is normal without lesions Vagina is pink moist without discharge Cervix normal in appearance and pap is done Uterus is normal size shape and contour retroverted Adnexa is negative with normal sized ovaries   Musculoskeletal: Normal range of motion. She exhibits no edema and no tenderness.  Neurological: She  is alert and oriented to person, place, and time. She has normal reflexes. She displays normal reflexes. No cranial nerve deficit. She exhibits normal muscle tone. Coordination normal.  Skin: Skin is warm and dry. No rash noted. No erythema. No pallor.  Psychiatric: She has a normal mood and affect. Her behavior is normal. Judgment and thought content normal.       Medications Ordered at today's visit: No orders of the defined types were placed in this encounter.   Other orders placed at today's visit: No orders of the defined types were placed in this encounter.     Assessment:    Normal Gyn exam.    Plan:    Mammogram ordered. Follow up in: 3 years.    S/p ablation Return in about 3 years (around 09/01/2022) for yearly.

## 2019-09-02 ENCOUNTER — Encounter (HOSPITAL_COMMUNITY): Payer: PPO | Admitting: Physical Therapy

## 2019-09-02 ENCOUNTER — Encounter (HOSPITAL_COMMUNITY): Payer: Self-pay

## 2019-09-02 ENCOUNTER — Ambulatory Visit (HOSPITAL_COMMUNITY): Payer: PPO | Attending: Orthopaedic Surgery

## 2019-09-02 DIAGNOSIS — R2689 Other abnormalities of gait and mobility: Secondary | ICD-10-CM | POA: Diagnosis not present

## 2019-09-02 DIAGNOSIS — M25572 Pain in left ankle and joints of left foot: Secondary | ICD-10-CM

## 2019-09-02 NOTE — Patient Instructions (Addendum)
Single Leg Balance: Eyes Open    Stand on right leg with eyes open. Hold 30 seconds. 5 reps 2 times per day.  http://ggbe.exer.us/5   Copyright  VHI. All rights reserved.   Tandem Stance    Right foot in front of left, heel touching toe both feet "straight ahead". Stand on Foot Triangle of Support with both feet.  Balance in this position 30 seconds. Do with left foot in front of right.  Copyright  VHI. All rights reserved.   Gastroc Stretch    Stand with right foot back, leg straight, forward leg bent. Keeping heel on floor, turned slightly out, lean into wall until stretch is felt in calf. Hold 30 seconds. Repeat 3 times per set. Do 2 sets per session.   http://orth.exer.us/27   Copyright  VHI. All rights reserved.

## 2019-09-02 NOTE — Therapy (Signed)
[facility] [address] [place], [place], [address] Phone: [phone]   Fax:  [phone]  Physical Therapy Treatment  Patient Details  Name: Sara Vance MRN: [id number] Date of Birth: [birth year] Referring Provider (PT): Sanjuana Kava MD   Encounter Date: 09/02/2019  PT End of Session - 09/02/19 1630    Visit Number  3    Number of Visits  12    Date for PT Re-Evaluation  10/02/19    Authorization Type  [employer], no V.L. no auth    Progress Note Due on Visit  10    PT Start Time  1627    PT Stop Time  1705    PT Time Calculation (min)  38 min    Activity Tolerance  Patient tolerated treatment well    Behavior During Therapy  [facility] for tasks assessed/performed       Past Medical History:  Diagnosis Date  . Anxiety   . Bipolar 1 disorder ([place])   . IBS (irritable bowel syndrome)     Past Surgical History:  Procedure Laterality Date  . CHOLECYSTECTOMY    . ENDOMETRIAL ABLATION      There were no vitals filed for this visit.  Subjective Assessment - 09/02/19 1629    Subjective  Pt stated she has been compliance with all HEP exercises, some soreness following.  No reoprts of pain currently, has been applying ice and elevation.  Reports she has weaned out of brace expect for when she pushes trash to street with gravel driveway    Patient Stated Goals  Want ankle not to hurt no more    Currently in Pain?  No/denies                       [facility] Adult PT Treatment/Exercise - 09/02/19 0001      Exercises   Exercises  Ankle      Ankle Exercises: Stretches   Gastroc Stretch  1 rep;30 seconds    Gastroc Stretch Limitations  against wall    Slant Board Stretch  3 reps;30 seconds    Slant Board Stretch Limitations  slant board      Ankle Exercises: Standing   SLS  Rt 11", Lt 8"    Rocker Board  2 minutes    Rocker Board Limitations  lateral; PF/DF    Heel Raises  15 reps    Toe Raise  15 reps    Other Standing Ankle Exercises  tandem stance 3x 30"; on foam 1x 30"    Other Standing Ankle Exercises  hip abd/ext 10x BLE; step up/down 4in step height x 10      Ankle Exercises: Seated   BAPS  Sitting;Level 3;15 reps    BAPS Limitations  DF/PF; Inv/Ev; CW/CCW               PT Short Term Goals - 08/20/19 1740      PT SHORT TERM GOAL #1   Title  Patient will be independent with initial HEP to improve functional outcomes    Time  3    Period  Weeks    Status  New    Target Date  09/11/19      PT SHORT TERM GOAL #2   Title  Patient will report at least 50% overall improvement in subjective complaint to indicate improvement in ability to perform ADLs.    Time  3    Period  Weeks  Status  New    Target Date  09/11/19        PT Long Term Goals - 08/20/19 1740      PT LONG TERM GOAL #1   Title  Patient will improve FOTO score to <30% to indicate improvement in functional outcomes    Time  6    Period  Weeks    Status  New    Target Date  10/02/19      PT LONG TERM GOAL #2   Title  Patient will be able to maintain single limb stance >30 seconds on BLEs to improve stability and reduce risk for falls    Time  6    Period  Weeks    Status  New    Target Date  10/02/19      PT LONG TERM GOAL #3   Title  Patient will have equal to or > 4+/5 MMT throughout LT ankle to improve ability to perform functional mobility, stair ambulation and ADLs.    Time  6    Period  Weeks    Status  New    Target Date  10/02/19      PT LONG TERM GOAL #4   Title  Patient will report at least 75% overall improvement in subjective complaint to indicate improvement in ability to perform ADLs.    Time  6    Period  Weeks    Status  New    Target Date  10/02/19            Plan - 09/02/19 1658    Clinical Impression Statement  Progressed to [facility] for functional strengthening and balance training.  Pt able to complete all exericses with no reoprts of pain, was limited by fatigue  wiht activitiy.  Pt able to complete BAPS on L3, improved ankle mobility noted.  Added balalnce and gastroc stretches to HEP.  Encouraged ice and elevation following therapy for pain and edema control.    Examination-Activity Limitations  Lift;Stand;Locomotion Level;Bend;Transfers;Squat;Stairs;Hygiene/Grooming;Dressing    Examination-Participation Restrictions  Yard Work;Community Activity;Laundry;Volunteer;Cleaning    Stability/Clinical Decision Making  Stable/Uncomplicated    Clinical Decision Making  Low    Rehab Potential  Good    PT Frequency  2x / week    PT Duration  6 weeks    PT Treatment/Interventions  ADLs/Self Care Home Management;Aquatic Therapy;Biofeedback;Cryotherapy;Electrical Stimulation;Moist Heat;Traction;Parrafin;Fluidtherapy;Contrast Bath;Ultrasound;Therapeutic exercise;Orthotic Fit/Training;Compression bandaging;Manual lymph drainage;Patient/family education;Therapeutic activities;Manual techniques;Functional mobility training;Stair training;Neuromuscular re-education;Balance training;DME Instruction;Gait training;Iontophoresis 4mg /ml Dexamethasone;Passive range of motion;Dry needling;Energy conservation;Spinal Manipulations;Splinting;Joint Manipulations;Vasopneumatic Device;Taping    PT Next Visit Plan  Progress ankle stabilization and LLE strengthening as tolerated.  Trial standing BAPS L2, progress standing balance.  Add cone tapping next session.    PT Home Exercise Plan  08/20/19: ankle PF with RTB, ankle circles, ankle ABCs; 4/27:RTB DF, Inv/Ev, heel raise; 09/02/19:  gastroc stretch, SLS and tandem stance       Patient will benefit from skilled therapeutic intervention in order to improve the following deficits and impairments:  Abnormal gait, Pain, Decreased mobility, Decreased activity tolerance, Decreased range of motion, Decreased strength, Hypomobility, Decreased balance, Difficulty walking, Impaired flexibility  Visit Diagnosis: Other abnormalities of gait and  mobility  Pain in left ankle and joints of left foot     Problem List Patient Active Problem List   Diagnosis Date Noted  . Foot pain, left 07/20/2019  . Incontinence overflow, stress female 12/29/2018  . Irritable bowel syndrome 12/29/2018  . Fx lateral malleolus-closed 05/07/2012  Ihor Austin, LPTA/CLT; CBIS [phone]  Aldona Lento 09/02/2019, 5:15 PM  [facility] [address] [place], [place], [address] Phone: [phone]   Fax:  [phone]  Name: Sara Vance MRN: [id number] Date of Birth: 02-Sep-1975

## 2019-09-03 LAB — CYTOLOGY - PAP
Comment: NEGATIVE
Diagnosis: NEGATIVE
Diagnosis: REACTIVE
High risk HPV: NEGATIVE

## 2019-09-04 ENCOUNTER — Other Ambulatory Visit: Payer: Self-pay

## 2019-09-04 ENCOUNTER — Ambulatory Visit (HOSPITAL_COMMUNITY): Payer: PPO | Admitting: Physical Therapy

## 2019-09-04 ENCOUNTER — Encounter (HOSPITAL_COMMUNITY): Payer: Self-pay | Admitting: Physical Therapy

## 2019-09-04 DIAGNOSIS — M25572 Pain in left ankle and joints of left foot: Secondary | ICD-10-CM

## 2019-09-04 DIAGNOSIS — R2689 Other abnormalities of gait and mobility: Secondary | ICD-10-CM | POA: Diagnosis not present

## 2019-09-04 NOTE — Therapy (Signed)
[facility] [address] [place], [place], [address] Phone: [phone]   Fax:  [phone]  Physical Therapy Treatment  Patient Details  Name: Sara Vance MRN: [id number] Date of Birth: 1975/08/13 Referring Provider (PT): Sanjuana Kava MD   Encounter Date: 09/04/2019  PT End of Session - 09/04/19 1545    Visit Number  4    Number of Visits  12    Date for PT Re-Evaluation  10/02/19    Authorization Type  [employer], no V.L. no auth    Progress Note Due on Visit  10    PT Start Time  1522    PT Stop Time  1600    PT Time Calculation (min)  38 min    Activity Tolerance  Patient tolerated treatment well    Behavior During Therapy  [facility] for tasks assessed/performed       Past Medical History:  Diagnosis Date  . Anxiety   . Bipolar 1 disorder ([place])   . IBS (irritable bowel syndrome)     Past Surgical History:  Procedure Laterality Date  . CHOLECYSTECTOMY    . ENDOMETRIAL ABLATION      There were no vitals filed for this visit.  Subjective Assessment - 09/04/19 1535    Subjective  Patient reported that she is doing well, but that she had some swelling in her ankle today.    Patient Stated Goals  Want ankle not to hurt no more    Currently in Pain?  No/denies                       [facility] Adult PT Treatment/Exercise - 09/04/19 0001      Ambulation/Gait   Ambulation/Gait  Yes    Ambulation Distance (Feet)  50 Feet    Gait Comments  observing gait, increased calcaneal eversion in stance       Ankle Exercises: Sidelying   Other Sidelying Ankle Exercises  Sidelying hip abduction x 15 each LE      Ankle Exercises: Supine   Other Supine Ankle Exercises  Bil LEs over small green physioball 2x15 knee and hip flexion to decrease edema       Ankle Exercises: Seated   Towel Inversion/Eversion  Other (comment)   x10 each direction     Ankle Exercises: Standing   Rocker Board  2 minutes    Rocker  Board Limitations  lateral; PF/DF    Heel Raises  15 reps    Toe Raise  15 reps    Other Standing Ankle Exercises  tandem stance 2x 30"; on foam 2x 30"               PT Short Term Goals - 08/20/19 1740      PT SHORT TERM GOAL #1   Title  Patient will be independent with initial HEP to improve functional outcomes    Time  3    Period  Weeks    Status  New    Target Date  09/11/19      PT SHORT TERM GOAL #2   Title  Patient will report at least 50% overall improvement in subjective complaint to indicate improvement in ability to perform ADLs.    Time  3    Period  Weeks    Status  New    Target Date  09/11/19        PT Long Term Goals - 08/20/19 1740  PT LONG TERM GOAL #1   Title  Patient will improve FOTO score to <30% to indicate improvement in functional outcomes    Time  6    Period  Weeks    Status  New    Target Date  10/02/19      PT LONG TERM GOAL #2   Title  Patient will be able to maintain single limb stance >30 seconds on BLEs to improve stability and reduce risk for falls    Time  6    Period  Weeks    Status  New    Target Date  10/02/19      PT LONG TERM GOAL #3   Title  Patient will have equal to or > 4+/5 MMT throughout LT ankle to improve ability to perform functional mobility, stair ambulation and ADLs.    Time  6    Period  Weeks    Status  New    Target Date  10/02/19      PT LONG TERM GOAL #4   Title  Patient will report at least 75% overall improvement in subjective complaint to indicate improvement in ability to perform ADLs.    Time  6    Period  Weeks    Status  New    Target Date  10/02/19            Plan - 09/04/19 1603    Clinical Impression Statement  Focused on edema reduction and ROM this session. Added supine knee and hip flexion over a physioball with pumping motion to reduce edema in the ankle. Observed patient's barefoot gait this session with noted great toe adduction as well as calcaneal eversion  bilaterally. Added towel eversion and inversion for ankle mobility and strengthening. Plan to resume more functional strengthening next session.    Examination-Activity Limitations  Lift;Stand;Locomotion Level;Bend;Transfers;Squat;Stairs;Hygiene/Grooming;Dressing    Examination-Participation Restrictions  Yard Work;Community Activity;Laundry;Volunteer;Cleaning    Stability/Clinical Decision Making  Stable/Uncomplicated    Rehab Potential  Good    PT Frequency  2x / week    PT Duration  6 weeks    PT Treatment/Interventions  ADLs/Self Care Home Management;Aquatic Therapy;Biofeedback;Cryotherapy;Electrical Stimulation;Moist Heat;Traction;Parrafin;Fluidtherapy;Contrast Bath;Ultrasound;Therapeutic exercise;Orthotic Fit/Training;Compression bandaging;Manual lymph drainage;Patient/family education;Therapeutic activities;Manual techniques;Functional mobility training;Stair training;Neuromuscular re-education;Balance training;DME Instruction;Gait training;Iontophoresis 4mg /ml Dexamethasone;Passive range of motion;Dry needling;Energy conservation;Spinal Manipulations;Splinting;Joint Manipulations;Vasopneumatic Device;Taping    PT Next Visit Plan  Progress ankle stabilization and LLE strengthening as tolerated.  Trial standing BAPS L2, progress standing balance.  Add cone tapping next session.    PT Home Exercise Plan  08/20/19: ankle PF with RTB, ankle circles, ankle ABCs; 4/27:RTB DF, Inv/Ev, heel raise; 09/02/19:  gastroc stretch, SLS and tandem stance       Patient will benefit from skilled therapeutic intervention in order to improve the following deficits and impairments:  Abnormal gait, Pain, Decreased mobility, Decreased activity tolerance, Decreased range of motion, Decreased strength, Hypomobility, Decreased balance, Difficulty walking, Impaired flexibility  Visit Diagnosis: Other abnormalities of gait and mobility  Pain in left ankle and joints of left foot     Problem List Patient Active  Problem List   Diagnosis Date Noted  . Foot pain, left 07/20/2019  . Incontinence overflow, stress female 12/29/2018  . Irritable bowel syndrome 12/29/2018  . Fx lateral malleolus-closed 05/07/2012   Clarene Critchley PT, DPT 4:04 PM, 09/04/19 [place], [place], [address] Phone: [phone]   Fax:  [phone]  Name: Persia R Wheeler  MRN: [id number] Date of Birth: [birth year]

## 2019-09-08 ENCOUNTER — Encounter: Payer: Self-pay | Admitting: Orthopaedic Surgery

## 2019-09-08 ENCOUNTER — Other Ambulatory Visit: Payer: Self-pay

## 2019-09-08 ENCOUNTER — Ambulatory Visit: Payer: PPO | Admitting: Orthopaedic Surgery

## 2019-09-08 VITALS — BP 120/65 | HR 87 | Ht 64.0 in | Wt 183.0 lb

## 2019-09-08 DIAGNOSIS — M25572 Pain in left ankle and joints of left foot: Secondary | ICD-10-CM

## 2019-09-08 NOTE — Progress Notes (Signed)
Patient [id number] Sara Vance, female DOB:[birth year], 44 y.o. [id number]  Chief Complaint  Patient presents with  . Ankle Pain    HPI  Sara Vance is a 44 y.o. female who has chronic left ankle pain. She has been going to PT and is slowly getting better.  I have read the PT notes.  She needs to continue PT.  She has less swelling and no redness.  She has no new trauma.  She still has pain but it is less.   Body mass index is 31.41 kg/m.  ROS  Review of Systems  Constitutional: Positive for activity change.  Musculoskeletal: Positive for arthralgias, gait problem and joint swelling.  Psychiatric/Behavioral: The patient is nervous/anxious.   All other systems reviewed and are negative.   All other systems reviewed and are negative.  The following is a summary of the past history medically, past history surgically, known current medicines, social history and family history.  This information is gathered electronically by the computer from prior information and documentation.  I review this each visit and have found including this information at this point in the chart is beneficial and informative.    Past Medical History:  Diagnosis Date  . Anxiety   . Bipolar 1 disorder ([place])   . IBS (irritable bowel syndrome)     Past Surgical History:  Procedure Laterality Date  . CHOLECYSTECTOMY    . ENDOMETRIAL ABLATION      Family History  Problem Relation Age of Onset  . Heart disease Other   . Cancer Other   . Arthritis Other   . Diabetes Other   . Stroke Maternal Grandmother   . Cancer Father   . Crohn's disease Mother   . Cerebral palsy Brother   . ADD / ADHD Daughter     Social History Social History   Tobacco Use  . Smoking status: Never Smoker  . Smokeless tobacco: Never Used  Substance Use Topics  . Alcohol use: No  . Drug use: No    Allergies  Allergen Reactions  . Latex Itching    Bleeding, Redness    Current Outpatient Medications  Medication  Sig Dispense Refill  . acetaminophen (TYLENOL) 500 MG tablet Take 500 mg by mouth every 6 (six) hours as needed. For headache    . amphetamine-dextroamphetamine (ADDERALL) 20 MG tablet Take 20 mg by mouth daily.      . ARIPiprazole (ABILIFY) 10 MG tablet Take 10 mg by mouth daily.    Marland Kitchen buPROPion (WELLBUTRIN XL) 150 MG 24 hr tablet Take 450 mg by mouth daily.      . clonazePAM (KLONOPIN) 1 MG tablet Take 1 mg by mouth at bedtime.    . gabapentin (NEURONTIN) 800 MG tablet Take 1,200 mg by mouth 2 (two) times daily. 400 in the mornings 800 at bedtime    . ibuprofen (ADVIL,MOTRIN) 200 MG tablet Take 400 mg by mouth every 6 (six) hours as needed. For pain    . perphenazine (TRILAFON) 16 MG tablet Take 16 mg by mouth daily.     . traZODone (DESYREL) 150 MG tablet Take 300 mg by mouth at bedtime.      No current facility-administered medications for this visit.     Physical Exam  Blood pressure 120/65, pulse 87, height 5\' 4"  (1.626 m), weight 183 lb (83 kg), last menstrual period 08/26/2019.  Constitutional: overall normal hygiene, normal nutrition, well developed, normal grooming, normal body habitus. Assistive device:none  Musculoskeletal: gait and station Limp  left, muscle tone and strength are normal, no tremors or atrophy is present.  .  Neurological: coordination overall normal.  Deep tendon reflex/nerve stretch intact.  Sensation normal.  Cranial nerves II-XII intact.   Skin:   Normal overall no scars, lesions, ulcers or rashes. No psoriasis.  Psychiatric: Alert and oriented x 3.  Recent memory intact, remote memory unclear.  Normal mood and affect. Well groomed.  Good eye contact.  Cardiovascular: overall no swelling, no varicosities, no edema bilaterally, normal temperatures of the legs and arms, no clubbing, cyanosis and good capillary refill.  Lymphatic: palpation is normal.  Left ankle has no swelling today and full ROM. She has a limp to the left.  All other systems reviewed  and are negative   The patient has been educated about the nature of the problem(s) and counseled on treatment options.  The patient appeared to understand what I have discussed and is in agreement with it.  Encounter Diagnosis  Name Primary?  . Pain in left ankle and joints of left foot Yes    PLAN Call if any problems.  Precautions discussed.  Continue current medications.   Return to clinic 1 month   Continue PT.  Electronically Signed Sanjuana Kava, MD 5/11/202110:43 AM

## 2019-09-09 ENCOUNTER — Encounter (HOSPITAL_COMMUNITY): Payer: Self-pay | Admitting: Physical Therapy

## 2019-09-09 ENCOUNTER — Ambulatory Visit (HOSPITAL_COMMUNITY): Payer: PPO | Admitting: Physical Therapy

## 2019-09-09 DIAGNOSIS — R2689 Other abnormalities of gait and mobility: Secondary | ICD-10-CM

## 2019-09-09 DIAGNOSIS — M25572 Pain in left ankle and joints of left foot: Secondary | ICD-10-CM

## 2019-09-09 NOTE — Therapy (Signed)
[facility], [place], [address] Phone: [phone]   Fax:  [phone]  Physical Therapy Treatment  Patient Details  Name: Sara Vance MRN: [id number] Date of Birth: [birth year] Referring Provider (PT): Sanjuana Kava MD   Encounter Date: 09/09/2019  PT End of Session - 09/09/19 1523    Visit Number  5    Number of Visits  12    Date for PT Re-Evaluation  10/02/19    Authorization Type  [employer], no V.L. no auth    Progress Note Due on Visit  10    PT Start Time  1520    PT Stop Time  1605    PT Time Calculation (min)  45 min    Activity Tolerance  Patient tolerated treatment well    Behavior During Therapy  [facility] for tasks assessed/performed       Past Medical History:  Diagnosis Date  . Anxiety   . Bipolar 1 disorder ([facility])   . IBS (irritable bowel syndrome)     Past Surgical History:  Procedure Laterality Date  . CHOLECYSTECTOMY    . ENDOMETRIAL ABLATION      There were no vitals filed for this visit.  Subjective Assessment - 09/09/19 1523    Subjective  Patient reports no pain currently, still hurts a little and is stiff in the mornings.  Says ankle is doing better. Says she is able to walk some without her brace. Says balance exercise still hurt but other HEP is going well.    Patient Stated Goals  Want ankle not to hurt no more    Currently in Pain?  No/denies                        [facility] Adult PT Treatment/Exercise - 09/09/19 0001      Exercises   Exercises  Knee/Hip      Knee/Hip Exercises: Standing   Hip Abduction  Both;2 sets;10 reps    Hip Extension  Both;2 sets;10 reps    Lateral Step Up  Left;1 set;20 reps;Hand Hold: 2;Step Height: 4"    Forward Step Up  Left;1 set;20 reps;Hand Hold: 2;Step Height: 4"    SLS with Vectors  3 way, 2 x 10" on LT SLS     Other Standing Knee Exercises  sidestepping 15' 2 RT       Ankle Exercises: Seated   Other Seated Ankle  Exercises  ankle INV/EV with GTB x20 ea    Other Seated Ankle Exercises  seated ankle (windshield wipers) INV/EV with towel x30       Ankle Exercises: Standing   BAPS  Standing;Level 2   FWD/ Lateral/ CW/CCW x20 each    Rocker Board  2 minutes    Rocker Board Limitations  lateral; PF/DF    Heel Raises  20 reps    Toe Raise  20 reps    Other Standing Ankle Exercises  tandem stance 2x 30"; on foam               PT Short Term Goals - 08/20/19 1740      PT SHORT TERM GOAL #1   Title  Patient will be independent with initial HEP to improve functional outcomes    Time  3    Period  Weeks    Status  New    Target Date  09/11/19      PT SHORT TERM GOAL #2  Title  Patient will report at least 50% overall improvement in subjective complaint to indicate improvement in ability to perform ADLs.    Time  3    Period  Weeks    Status  New    Target Date  09/11/19        PT Long Term Goals - 08/20/19 1740      PT LONG TERM GOAL #1   Title  Patient will improve FOTO score to <30% to indicate improvement in functional outcomes    Time  6    Period  Weeks    Status  New    Target Date  10/02/19      PT LONG TERM GOAL #2   Title  Patient will be able to maintain single limb stance >30 seconds on BLEs to improve stability and reduce risk for falls    Time  6    Period  Weeks    Status  New    Target Date  10/02/19      PT LONG TERM GOAL #3   Title  Patient will have equal to or > 4+/5 MMT throughout LT ankle to improve ability to perform functional mobility, stair ambulation and ADLs.    Time  6    Period  Weeks    Status  New    Target Date  10/02/19      PT LONG TERM GOAL #4   Title  Patient will report at least 75% overall improvement in subjective complaint to indicate improvement in ability to perform ADLs.    Time  6    Period  Weeks    Status  New    Target Date  10/02/19            Plan - 09/09/19 1728    Clinical Impression Statement  Patient  tolerated session well today with no increased complaint of pain. Patient did note some discomfort in LT ankle with standing hip abduction on RT, but decreased with cues for proper form. Progressed standing balance activity and increased resistance band with ankle INV/ EV. Patient was well challenged with added SLS with vectors and requires intermittent HHA for stability. Patient educated on HEP progression and issued GTB for home. Patient will continue to benefit from skilled therapy services to progress ankle strength and stabilization to reduce pain and improve level of function with functional mobility.    Examination-Activity Limitations  Lift;Stand;Locomotion Level;Bend;Transfers;Squat;Stairs;Hygiene/Grooming;Dressing    Examination-Participation Restrictions  Yard Work;Community Activity;Laundry;Volunteer;Cleaning    Stability/Clinical Decision Making  Stable/Uncomplicated    Rehab Potential  Good    PT Frequency  2x / week    PT Duration  6 weeks    PT Treatment/Interventions  ADLs/Self Care Home Management;Aquatic Therapy;Biofeedback;Cryotherapy;Electrical Stimulation;Moist Heat;Traction;Parrafin;Fluidtherapy;Contrast Bath;Ultrasound;Therapeutic exercise;Orthotic Fit/Training;Compression bandaging;Manual lymph drainage;Patient/family education;Therapeutic activities;Manual techniques;Functional mobility training;Stair training;Neuromuscular re-education;Balance training;DME Instruction;Gait training;Iontophoresis 4mg /ml Dexamethasone;Passive range of motion;Dry needling;Energy conservation;Spinal Manipulations;Splinting;Joint Manipulations;Vasopneumatic Device;Taping    PT Next Visit Plan  Progress ankle stabilization and LLE strengthening as tolerated.  Increase step height, add cone tapping next session.    PT Home Exercise Plan  08/20/19: ankle PF with RTB, ankle circles, ankle ABCs; 4/27:RTB DF, Inv/Ev, heel raise; 09/02/19:  gastroc stretch, SLS and tandem stance    Consulted and Agree with Plan  of Care  Patient       Patient will benefit from skilled therapeutic intervention in order to improve the following deficits and impairments:  Abnormal gait, Pain, Decreased mobility, Decreased activity tolerance, Decreased range of  motion, Decreased strength, Hypomobility, Decreased balance, Difficulty walking, Impaired flexibility  Visit Diagnosis: Other abnormalities of gait and mobility  Pain in left ankle and joints of left foot     Problem List Patient Active Problem List   Diagnosis Date Noted  . Foot pain, left 07/20/2019  . Incontinence overflow, stress female 12/29/2018  . Irritable bowel syndrome 12/29/2018  . Fx lateral malleolus-closed 05/07/2012    5:30 PM, 09/09/19 Josue Hector PT DPT  Physical Therapist with [facility]  [phone]   [facility] [address] [place], [place], [address] Phone: [phone]   Fax:  [phone]  Name: Sara Vance MRN: [id number] Date of Birth: [birth year]

## 2019-09-10 ENCOUNTER — Ambulatory Visit (HOSPITAL_COMMUNITY)
Admission: RE | Admit: 2019-09-10 | Discharge: 2019-09-10 | Disposition: A | Discharge status: Home / Self Care | Payer: PPO | Source: Ambulatory Visit | Attending: Obstetrics & Gynecology | Admitting: Obstetrics & Gynecology

## 2019-09-10 ENCOUNTER — Other Ambulatory Visit: Payer: Self-pay

## 2019-09-10 DIAGNOSIS — Z1231 Encounter for screening mammogram for malignant neoplasm of breast: Secondary | ICD-10-CM

## 2019-09-11 ENCOUNTER — Ambulatory Visit (HOSPITAL_COMMUNITY): Payer: PPO

## 2019-09-11 ENCOUNTER — Other Ambulatory Visit (HOSPITAL_COMMUNITY): Payer: Self-pay | Admitting: Obstetrics & Gynecology

## 2019-09-11 ENCOUNTER — Encounter (HOSPITAL_COMMUNITY): Payer: Self-pay

## 2019-09-11 DIAGNOSIS — R928 Other abnormal and inconclusive findings on diagnostic imaging of breast: Secondary | ICD-10-CM

## 2019-09-11 DIAGNOSIS — R2689 Other abnormalities of gait and mobility: Secondary | ICD-10-CM

## 2019-09-11 DIAGNOSIS — M25572 Pain in left ankle and joints of left foot: Secondary | ICD-10-CM

## 2019-09-11 NOTE — Therapy (Signed)
[facility] [address] [place], [place], [address] Phone: [phone]   Fax:  [phone]  Physical Therapy Treatment  Patient Details  Name: Sara Vance MRN: [id number] Date of Birth: June 13, 1975 Referring Provider (PT): Sanjuana Kava MD   Encounter Date: 09/11/2019  PT End of Session - 09/11/19 1405    Visit Number  6    Number of Visits  12    Date for PT Re-Evaluation  10/02/19    Authorization Type  [employer], no V.L. no auth    Progress Note Due on Visit  10    PT Start Time  1402    PT Stop Time  1443    PT Time Calculation (min)  41 min    Activity Tolerance  Patient tolerated treatment well    Behavior During Therapy  [facility] for tasks assessed/performed       Past Medical History:  Diagnosis Date  . Anxiety   . Bipolar 1 disorder ([place])   . IBS (irritable bowel syndrome)     Past Surgical History:  Procedure Laterality Date  . CHOLECYSTECTOMY    . ENDOMETRIAL ABLATION      There were no vitals filed for this visit.  Subjective Assessment - 09/11/19 1404    Subjective  Pt stated her ankle is a littel stiff, no reports of pain today.  Has been going without ankle brace for a week of two now.    Patient Stated Goals  Want ankle not to hurt no more    Currently in Pain?  No/denies    Pain Descriptors / Indicators  Tightness                        [facility] Adult PT Treatment/Exercise - 09/11/19 0001      Exercises   Exercises  Knee/Hip      Knee/Hip Exercises: Standing   Heel Raises  20 reps    Lateral Step Up  Left;15 reps;Hand Hold: 2;Step Height: 6"    Forward Step Up  Left;15 reps    SLS  toe tapping 3 cone 2 sets x 5 reps    Other Standing Knee Exercises  tandem stance on foam 3x 30"      Ankle Exercises: Seated   BAPS  Standing;Level 2;10 reps    BAPS Limitations  DF/PF; Inv/Ev; CW/CCW      Ankle Exercises: Standing   BAPS  Standing;Level 2;10 reps   lateral; PF/DF; CW/CCW    SLS  Toe tapping 3 cone 2 sets x 5 reps    Heel Raises  20 reps    Toe Raise  20 reps    Other Standing Ankle Exercises  tandem stance 3x 30" on foam; sidestep 2RT wiht RTB around thigh               PT Short Term Goals - 08/20/19 1740      PT SHORT TERM GOAL #1   Title  Patient will be independent with initial HEP to improve functional outcomes    Time  3    Period  Weeks    Status  New    Target Date  09/11/19      PT SHORT TERM GOAL #2   Title  Patient will report at least 50% overall improvement in subjective complaint to indicate improvement in ability to perform ADLs.    Time  3    Period  Weeks  Status  New    Target Date  09/11/19        PT Long Term Goals - 08/20/19 1740      PT LONG TERM GOAL #1   Title  Patient will improve FOTO score to <30% to indicate improvement in functional outcomes    Time  6    Period  Weeks    Status  New    Target Date  10/02/19      PT LONG TERM GOAL #2   Title  Patient will be able to maintain single limb stance >30 seconds on BLEs to improve stability and reduce risk for falls    Time  6    Period  Weeks    Status  New    Target Date  10/02/19      PT LONG TERM GOAL #3   Title  Patient will have equal to or > 4+/5 MMT throughout LT ankle to improve ability to perform functional mobility, stair ambulation and ADLs.    Time  6    Period  Weeks    Status  New    Target Date  10/02/19      PT LONG TERM GOAL #4   Title  Patient will report at least 75% overall improvement in subjective complaint to indicate improvement in ability to perform ADLs.    Time  6    Period  Weeks    Status  New    Target Date  10/02/19            Plan - 09/11/19 1433    Clinical Impression Statement  Session focus with ankle stabilty with increased focus on SLS activities.  Added toe tapping on 3 cones with moderate difficulty and need for HHA for LOB episodes with new activities.  Pt did c/o dizziness x 1 episodes attempted to  assess BP though unable to get good measurement due to UE size, pt reports symptoms of medication.  EOS pt limited by fatigue iwht activities.    Examination-Activity Limitations  Lift;Stand;Locomotion Level;Bend;Transfers;Squat;Stairs;Hygiene/Grooming;Dressing    Examination-Participation Restrictions  Yard Work;Community Activity;Laundry;Volunteer;Cleaning    Stability/Clinical Decision Making  Stable/Uncomplicated    Clinical Decision Making  Low    PT Frequency  2x / week    PT Duration  6 weeks    PT Treatment/Interventions  ADLs/Self Care Home Management;Aquatic Therapy;Biofeedback;Cryotherapy;Electrical Stimulation;Moist Heat;Traction;Parrafin;Fluidtherapy;Contrast Bath;Ultrasound;Therapeutic exercise;Orthotic Fit/Training;Compression bandaging;Manual lymph drainage;Patient/family education;Therapeutic activities;Manual techniques;Functional mobility training;Stair training;Neuromuscular re-education;Balance training;DME Instruction;Gait training;Iontophoresis 4mg /ml Dexamethasone;Passive range of motion;Dry needling;Energy conservation;Spinal Manipulations;Splinting;Joint Manipulations;Vasopneumatic Device;Taping    PT Next Visit Plan  Progress ankle stabilization and LLE strengthening as tolerated.  Continue SLS activities, add BLE balance master when ready.    PT Home Exercise Plan  08/20/19: ankle PF with RTB, ankle circles, ankle ABCs; 4/27:RTB DF, Inv/Ev, heel raise; 09/02/19:  gastroc stretch, SLS and tandem stance       Patient will benefit from skilled therapeutic intervention in order to improve the following deficits and impairments:  Abnormal gait, Pain, Decreased mobility, Decreased activity tolerance, Decreased range of motion, Decreased strength, Hypomobility, Decreased balance, Difficulty walking, Impaired flexibility  Visit Diagnosis: Pain in left ankle and joints of left foot  Other abnormalities of gait and mobility     Problem List Patient Active Problem List    Diagnosis Date Noted  . Foot pain, left 07/20/2019  . Incontinence overflow, stress female 12/29/2018  . Irritable bowel syndrome 12/29/2018  . Fx lateral malleolus-closed 05/07/2012   Ihor Austin, LPTA/CLT; Delana Meyer  [phone]  Aldona Lento 09/11/2019, 7:13 PM  [facility] [address] [place], [place], [address] Phone: [phone]   Fax:  [phone]  Name: Sara Vance MRN: [id number] Date of Birth: February 21, 1976

## 2019-09-16 ENCOUNTER — Ambulatory Visit (HOSPITAL_COMMUNITY): Payer: PPO | Admitting: Physical Therapy

## 2019-09-16 ENCOUNTER — Other Ambulatory Visit: Payer: Self-pay

## 2019-09-16 ENCOUNTER — Encounter (HOSPITAL_COMMUNITY): Payer: Self-pay | Admitting: Physical Therapy

## 2019-09-16 DIAGNOSIS — M25572 Pain in left ankle and joints of left foot: Secondary | ICD-10-CM

## 2019-09-16 DIAGNOSIS — R2689 Other abnormalities of gait and mobility: Secondary | ICD-10-CM

## 2019-09-16 NOTE — Therapy (Signed)
[facility] [address] [place], [place], [address] Phone: [phone]   Fax:  [phone]  Physical Therapy Treatment  Patient Details  Name: Sara Vance MRN: [id number] Date of Birth: 06/19/1975 Referring Provider (PT): Sanjuana Kava MD   Encounter Date: 09/16/2019  PT End of Session - 09/16/19 1439    Visit Number  7    Number of Visits  12    Date for PT Re-Evaluation  10/02/19    Authorization Type  [employer], no V.L. no auth    Progress Note Due on Visit  10    PT Start Time  1435    PT Stop Time  1515    PT Time Calculation (min)  40 min    Activity Tolerance  Patient tolerated treatment well;Patient limited by fatigue    Behavior During Therapy  [facility] for tasks assessed/performed       Past Medical History:  Diagnosis Date  . Anxiety   . Bipolar 1 disorder ([place])   . IBS (irritable bowel syndrome)     Past Surgical History:  Procedure Laterality Date  . CHOLECYSTECTOMY    . ENDOMETRIAL ABLATION      There were no vitals filed for this visit.  Subjective Assessment - 09/16/19 1437    Subjective  Patient says her ankle is doing "ok" today. A little sore after last visit. Says she feels its "about a 4 today". Notes some burning at front of LT ankle.    Patient Stated Goals  Want ankle not to hurt no more    Currently in Pain?  Yes    Pain Score  4     Pain Location  Ankle    Pain Orientation  Left;Anterior    Pain Descriptors / Indicators  Burning    Pain Type  Acute pain    Pain Onset  More than a month ago    Pain Frequency  Intermittent                        [facility] Adult PT Treatment/Exercise - 09/16/19 0001      Knee/Hip Exercises: Stretches   Gastroc Stretch  Both;3 reps;30 seconds    Gastroc Stretch Limitations  slant board      Knee/Hip Exercises: Standing   Heel Raises  20 reps    Heel Raises Limitations  from step     Hip Abduction  Both;1 set;15 reps;Knee straight     Hip Extension  Both;1 set;15 reps;Knee straight    Lateral Step Up  Left;15 reps;Hand Hold: 2;Step Height: 6"    Forward Step Up  Left;1 set;15 reps;Hand Hold: 2;Step Height: 6"    Functional Squat  2 sets;10 reps    Functional Squat Limitations  cues for even weight shift     Other Standing Knee Exercises  fitter board 20x DF/PF; x 20 lateral; 10x CW/CCW      Ankle Exercises: Standing   SLS  3 x 15" on foam     Other Standing Ankle Exercises  tandem stance 2x 30" on foam; tandem gait blue line 2RT                PT Short Term Goals - 08/20/19 1740      PT SHORT TERM GOAL #1   Title  Patient will be independent with initial HEP to improve functional outcomes    Time  3    Period  Weeks  Status  New    Target Date  09/11/19      PT SHORT TERM GOAL #2   Title  Patient will report at least 50% overall improvement in subjective complaint to indicate improvement in ability to perform ADLs.    Time  3    Period  Weeks    Status  New    Target Date  09/11/19        PT Long Term Goals - 08/20/19 1740      PT LONG TERM GOAL #1   Title  Patient will improve FOTO score to <30% to indicate improvement in functional outcomes    Time  6    Period  Weeks    Status  New    Target Date  10/02/19      PT LONG TERM GOAL #2   Title  Patient will be able to maintain single limb stance >30 seconds on BLEs to improve stability and reduce risk for falls    Time  6    Period  Weeks    Status  New    Target Date  10/02/19      PT LONG TERM GOAL #3   Title  Patient will have equal to or > 4+/5 MMT throughout LT ankle to improve ability to perform functional mobility, stair ambulation and ADLs.    Time  6    Period  Weeks    Status  New    Target Date  10/02/19      PT LONG TERM GOAL #4   Title  Patient will report at least 75% overall improvement in subjective complaint to indicate improvement in ability to perform ADLs.    Time  6    Period  Weeks    Status  New     Target Date  10/02/19            Plan - 09/16/19 1516    Clinical Impression Statement  Patient was well challenged with ther ex progressions. Patient noted increased fatigue in ankle and increased muscle soreness with SLS of foam. Patient was able to increase max hold time to 14 seconds unsupported. Patient cued on proper form and function of added fitter board, and tandem gait for dynamic balance. Patient will continue to benefit from skilled therapy services to progress ankle strength and stability to reduce pain and improve LOF with functional mobility.    Examination-Activity Limitations  Lift;Stand;Locomotion Level;Bend;Transfers;Squat;Stairs;Hygiene/Grooming;Dressing    Examination-Participation Restrictions  Yard Work;Community Activity;Laundry;Volunteer;Cleaning    Stability/Clinical Decision Making  Stable/Uncomplicated    PT Frequency  2x / week    PT Duration  6 weeks    PT Treatment/Interventions  ADLs/Self Care Home Management;Aquatic Therapy;Biofeedback;Cryotherapy;Electrical Stimulation;Moist Heat;Traction;Parrafin;Fluidtherapy;Contrast Bath;Ultrasound;Therapeutic exercise;Orthotic Fit/Training;Compression bandaging;Manual lymph drainage;Patient/family education;Therapeutic activities;Manual techniques;Functional mobility training;Stair training;Neuromuscular re-education;Balance training;DME Instruction;Gait training;Iontophoresis 4mg /ml Dexamethasone;Passive range of motion;Dry needling;Energy conservation;Spinal Manipulations;Splinting;Joint Manipulations;Vasopneumatic Device;Taping    PT Next Visit Plan  Progress ankle stabilization and LLE strengthening as tolerated.  Continue SLS activities, add BLE balance master when ready.    PT Home Exercise Plan  08/20/19: ankle PF with RTB, ankle circles, ankle ABCs; 4/27:RTB DF, Inv/Ev, heel raise; 09/02/19:  gastroc stretch, SLS and tandem stance       Patient will benefit from skilled therapeutic intervention in order to improve the  following deficits and impairments:  Abnormal gait, Pain, Decreased mobility, Decreased activity tolerance, Decreased range of motion, Decreased strength, Hypomobility, Decreased balance, Difficulty walking, Impaired flexibility  Visit Diagnosis: Pain in left  ankle and joints of left foot  Other abnormalities of gait and mobility     Problem List Patient Active Problem List   Diagnosis Date Noted  . Foot pain, left 07/20/2019  . Incontinence overflow, stress female 12/29/2018  . Irritable bowel syndrome 12/29/2018  . Fx lateral malleolus-closed 05/07/2012   4:25 PM, 09/16/19 Josue Hector PT DPT  Physical Therapist with [facility]  [phone]   [facility] [address] [place], [place], [address] Phone: [phone]   Fax:  [phone]  Name: Sara Vance MRN: [id number] Date of Birth: 03-27-1976

## 2019-09-17 ENCOUNTER — Encounter: Payer: Self-pay | Admitting: Internal Medicine

## 2019-09-17 ENCOUNTER — Ambulatory Visit (INDEPENDENT_AMBULATORY_CARE_PROVIDER_SITE_OTHER): Payer: PPO | Admitting: Internal Medicine

## 2019-09-17 VITALS — BP 120/80 | HR 75 | Temp 97.3°F | Ht 64.0 in | Wt 182.9 lb

## 2019-09-17 DIAGNOSIS — F319 Bipolar disorder, unspecified: Secondary | ICD-10-CM | POA: Insufficient documentation

## 2019-09-17 DIAGNOSIS — F259 Schizoaffective disorder, unspecified: Secondary | ICD-10-CM | POA: Insufficient documentation

## 2019-09-17 DIAGNOSIS — F250 Schizoaffective disorder, bipolar type: Secondary | ICD-10-CM

## 2019-09-17 DIAGNOSIS — E669 Obesity, unspecified: Secondary | ICD-10-CM

## 2019-09-17 NOTE — Progress Notes (Signed)
New Patient Office Visit     This visit occurred during the SARS-CoV-2 public health emergency.  Safety protocols were in place, including screening questions prior to the visit, additional usage of staff PPE, and extensive cleaning of exam room while observing appropriate contact time as indicated for disinfecting solutions.    CC/Reason for Visit: Establish care, discuss chronic medical conditions Previous PCP: Dr. Benny Lennert Last Visit: 2020  HPI: Fiana R Yox is a 44 y.o. female who is coming in today for the above mentioned reasons. Past Medical History is significant for: Schizoaffective disorder bipolar type followed closely by psychiatry, Dr. Reece Levy, on multiple psychiatric medications.  Other than this she has been relatively healthy.  She recently had a left ankle fracture and has been doing physical therapy.  She has mild obesity with a BMI of 31.  She has scheduled an appointment with dermatology for Arieonna due to which she believes is rosacea.  She sees a gynecologist, Dr. Elonda Husky.  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  Her family history is very significant for mental illness with the grandfather, father, brother and daughter with bipolar disorder.  She does not smoke, she does not drink, she does not have drug allergies.  Her surgical history significant for a cholecystectomy in 2005 and exploratory laparotomy with lysis of adhesions in 1997.  She has no acute complaints today.   Past Medical/Surgical History: Past Medical History:  Diagnosis Date  . Anxiety   . Bipolar 1 disorder ([place])   . IBS (irritable bowel syndrome)     Past Surgical History:  Procedure Laterality Date  . CHOLECYSTECTOMY    . ENDOMETRIAL ABLATION      Social History:  reports that she has never smoked. She has never used smokeless tobacco. She reports that she does not drink alcohol or use drugs.  Allergies: Allergies  Allergen Reactions  . Latex Itching    Bleeding,  Redness    Family History:  Family History  Problem Relation Age of Onset  . Heart disease Other   . Cancer Other   . Arthritis Other   . Diabetes Other   . Stroke Maternal Grandmother   . Cancer Father   . Crohn's disease Mother   . Cerebral palsy Brother   . ADD / ADHD Daughter      Current Outpatient Medications:  .  acetaminophen (TYLENOL) 500 MG tablet, Take 500 mg by mouth every 6 (six) hours as needed. For headache, Disp: , Rfl:  .  amphetamine-dextroamphetamine (ADDERALL) 20 MG tablet, Take 20 mg by mouth daily.  , Disp: , Rfl:  .  ARIPiprazole (ABILIFY) 10 MG tablet, Take 10 mg by mouth daily., Disp: , Rfl:  .  buPROPion (WELLBUTRIN XL) 150 MG 24 hr tablet, Take 450 mg by mouth daily.  , Disp: , Rfl:  .  clonazePAM (KLONOPIN) 1 MG tablet, Take 1 mg by mouth at bedtime., Disp: , Rfl:  .  gabapentin (NEURONTIN) 800 MG tablet, Take 1,200 mg by mouth 2 (two) times daily. 400 in the mornings 800 at bedtime, Disp: , Rfl:  .  ibuprofen (ADVIL,MOTRIN) 200 MG tablet, Take 400 mg by mouth every 6 (six) hours as needed. For pain, Disp: , Rfl:  .  perphenazine (TRILAFON) 16 MG tablet, Take 16 mg by mouth daily. , Disp: , Rfl:  .  traZODone (DESYREL) 150 MG tablet, Take 300 mg by mouth at bedtime. , Disp: , Rfl:  Review of Systems:  Constitutional: Denies fever, chills, diaphoresis, appetite change and fatigue.  HEENT: Denies photophobia, eye pain, redness, hearing loss, ear pain, congestion, sore throat, rhinorrhea, sneezing, mouth sores, trouble swallowing, neck pain, neck stiffness and tinnitus.   Respiratory: Denies SOB, DOE, cough, chest tightness,  and wheezing.   Cardiovascular: Denies chest pain, palpitations and leg swelling.  Gastrointestinal: Denies nausea, vomiting, abdominal pain, diarrhea, constipation, blood in stool and abdominal distention.  Genitourinary: Denies dysuria, urgency, frequency, hematuria, flank pain and difficulty urinating.  Endocrine: Denies: hot or  cold intolerance, sweats, changes in hair or nails, polyuria, polydipsia. Musculoskeletal: Denies myalgias, back pain, joint swelling, arthralgias and gait problem.  Skin: Denies pallor, rash and wound.  Neurological: Denies dizziness, seizures, syncope, weakness, light-headedness, numbness and headaches.  Hematological: Denies adenopathy. Easy bruising, personal or family bleeding history  Psychiatric/Behavioral: Denies suicidal ideation, mood changes, confusion, nervousness, sleep disturbance and agitation    Physical Exam: Vitals:   09/17/19 1358  BP: 120/80  Pulse: 75  Temp: (!) 97.3 F (36.3 C)  TempSrc: Temporal  SpO2: 96%  Weight: 182 lb 14.4 oz (83 kg)  Height: 5\' 4"  (1.626 m)   Body mass index is 31.39 kg/m.  Constitutional: NAD, calm, comfortable Eyes: PERRL, lids and conjunctivae normal ENMT: Mucous membranes are moist.  Respiratory: clear to auscultation bilaterally, no wheezing, no crackles. Normal respiratory effort. No accessory muscle use.  Cardiovascular: Regular rate and rhythm, no murmurs / rubs / gallops. No extremity edema.  Neurologic: Grossly intact and nonfocal Psychiatric: Normal judgment and insight. Alert and oriented x 3. Normal mood.    Impression and Plan:  Obesity (BMI 30.0-34.9)  -Discussed healthy lifestyle, including increased physical activity and better food choices to promote weight loss.  Bipolar 1 disorder (HCC) Schizoaffective disorder, bipolar type ([place]) -Followed closely by psychiatry.    Patient Instructions  -Nice seeing you today!!  -Schedule follow up appointment in 4 months for your physical. Please come in fasting that day.     Lelon Frohlich, MD [facility]

## 2019-09-17 NOTE — Patient Instructions (Signed)
-  Nice seeing you today!!  -Schedule follow up appointment in 4 months for your physical. Please come in fasting that day.

## 2019-09-18 ENCOUNTER — Ambulatory Visit (HOSPITAL_COMMUNITY): Payer: PPO | Admitting: Physical Therapy

## 2019-09-18 ENCOUNTER — Telehealth (HOSPITAL_COMMUNITY): Payer: Self-pay | Admitting: Occupational Therapy

## 2019-09-18 NOTE — Telephone Encounter (Signed)
Pt left message to cx today, has had an emergency come up and will be at next appt.    Guadelupe Sabin, OTR/L  [phone]

## 2019-09-22 ENCOUNTER — Other Ambulatory Visit: Payer: Self-pay

## 2019-09-22 ENCOUNTER — Ambulatory Visit (HOSPITAL_COMMUNITY)
Admission: RE | Admit: 2019-09-22 | Discharge: 2019-09-22 | Disposition: A | Discharge status: Home / Self Care | Payer: PPO | Source: Ambulatory Visit | Attending: Obstetrics & Gynecology | Admitting: Obstetrics & Gynecology

## 2019-09-22 DIAGNOSIS — R928 Other abnormal and inconclusive findings on diagnostic imaging of breast: Secondary | ICD-10-CM

## 2019-09-22 DIAGNOSIS — N6489 Other specified disorders of breast: Secondary | ICD-10-CM | POA: Diagnosis not present

## 2019-09-23 ENCOUNTER — Encounter (HOSPITAL_COMMUNITY): Payer: Self-pay | Admitting: Physical Therapy

## 2019-09-23 ENCOUNTER — Ambulatory Visit (HOSPITAL_COMMUNITY): Payer: PPO | Admitting: Physical Therapy

## 2019-09-23 DIAGNOSIS — R2689 Other abnormalities of gait and mobility: Secondary | ICD-10-CM

## 2019-09-23 DIAGNOSIS — L718 Other rosacea: Secondary | ICD-10-CM | POA: Diagnosis not present

## 2019-09-23 DIAGNOSIS — L218 Other seborrheic dermatitis: Secondary | ICD-10-CM | POA: Diagnosis not present

## 2019-09-23 DIAGNOSIS — M25572 Pain in left ankle and joints of left foot: Secondary | ICD-10-CM

## 2019-09-23 NOTE — Therapy (Signed)
[place], [place], [address] Phone: [phone]   Fax:  [phone]  Physical Therapy Treatment  Patient Details  Name: Sara Vance MRN: [id number] Date of Birth: 1975/12/08 Referring Provider (PT): Sanjuana Kava MD   Encounter Date: 09/23/2019  PT End of Session - 09/23/19 1437    Visit Number  8    Number of Visits  12    Date for PT Re-Evaluation  10/02/19    Authorization Type  [employer], no V.L. no auth    Progress Note Due on Visit  10    PT Start Time  [id number]    PT Stop Time  1515    PT Time Calculation (min)  41 min    Activity Tolerance  Patient tolerated treatment well    Behavior During Therapy  [facility] for tasks assessed/performed       Past Medical History:  Diagnosis Date  . Anxiety   . Bipolar 1 disorder ([place])   . IBS (irritable bowel syndrome)     Past Surgical History:  Procedure Laterality Date  . CHOLECYSTECTOMY    . ENDOMETRIAL ABLATION      There were no vitals filed for this visit.  Subjective Assessment - 09/23/19 1439    Subjective  Patient says her home exercise is going well. Says she lost 8 pounds last month, she has been working on a diet with her exercises. Says her ankle is doing better.    Patient Stated Goals  Want ankle not to hurt no more    Currently in Pain?  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 30# x10       Knee/Hip Exercises: Standing   Heel Raises  20 reps    Lateral Step Up  Left;15 reps;Hand Hold: 2;Step Height: 6"    Forward Step Up  Left;1 set;15 reps;Hand Hold: 2;Step Height: 6"    Step Down  Left;1 set;15 reps;Hand Hold: 2;Step Height:  6"    Functional Squat  10 reps;1 set    Functional Squat Limitations  cues for even weight shift     Other Standing Knee Exercises  fitter board 15x DF/PF; x 15 lateral; x15 CW/CCW with intermittent HHA     Other Standing Knee Exercises  tandem stance on foam 2x 30", SLS on foam 3 x 15" require intermittent  HHA for LT           Balance Exercises - 09/23/19 1651      Balance Exercises: Standing   Balance Beam  sidestepping x 2RT     Tandem Gait  Forward;2 reps    Sidestepping  2 reps    Other Standing Exercises  lateral grapevines x 2RT           PT Short Term Goals - 08/20/19 1740      PT SHORT TERM GOAL #1   Title  Patient will be independent with initial HEP to improve functional outcomes    Time  3    Period  Weeks  Status  New    Target Date  09/11/19      PT SHORT TERM GOAL #2   Title  Patient will report at least 50% overall improvement in subjective complaint to indicate improvement in ability to perform ADLs.    Time  3    Period  Weeks    Status  New    Target Date  09/11/19        PT Long Term Goals - 08/20/19 1740      PT LONG TERM GOAL #1   Title  Patient will improve FOTO score to <30% to indicate improvement in functional outcomes    Time  6    Period  Weeks    Status  New    Target Date  10/02/19      PT LONG TERM GOAL #2   Title  Patient will be able to maintain single limb stance >30 seconds on BLEs to improve stability and reduce risk for falls    Time  6    Period  Weeks    Status  New    Target Date  10/02/19      PT LONG TERM GOAL #3   Title  Patient will have equal to or > 4+/5 MMT throughout LT ankle to improve ability to perform functional mobility, stair ambulation and ADLs.    Time  6    Period  Weeks    Status  New    Target Date  10/02/19      PT LONG TERM GOAL #4   Title  Patient will report at least 75% overall improvement in subjective complaint to indicate improvement in ability to perform ADLs.    Time  6     Period  Weeks    Status  New    Target Date  10/02/19            Plan - 09/23/19 1653    Clinical Impression Statement  Patient with improved tolerance to activity today. Patient reports no increased pain during treatment. Continues to have difficulty with stability in SLS on LT using foam pad, but is able to improve with minimal HHA. Able to progress dynamic stability today with adding grapevines, sidestepping on foam beam and machine walkouts without complaint. Patient educated on proper form and function of all added exercise. Patient did note mild muscle fatigue about LT ankle post treatment. Patient will continue to benefit from skilled therapy services to progress ankle strength and stabilization to reduce pain and reduce future risk for falls.    Examination-Activity Limitations  Lift;Stand;Locomotion Level;Bend;Transfers;Squat;Stairs;Hygiene/Grooming;Dressing    Examination-Participation Restrictions  Yard Work;Community Activity;Laundry;Volunteer;Cleaning    Stability/Clinical Decision Making  Stable/Uncomplicated    PT Frequency  2x / week    PT Duration  6 weeks    PT Treatment/Interventions  ADLs/Self Care Home Management;Aquatic Therapy;Biofeedback;Cryotherapy;Electrical Stimulation;Moist Heat;Traction;Parrafin;Fluidtherapy;Contrast Bath;Ultrasound;Therapeutic exercise;Orthotic Fit/Training;Compression bandaging;Manual lymph drainage;Patient/family education;Therapeutic activities;Manual techniques;Functional mobility training;Stair training;Neuromuscular re-education;Balance training;DME Instruction;Gait training;Iontophoresis 4mg /ml Dexamethasone;Passive range of motion;Dry needling;Energy conservation;Spinal Manipulations;Splinting;Joint Manipulations;Vasopneumatic Device;Taping    PT Next Visit Plan  Progress ankle stabilization and LLE strengthening as tolerated.  Continue SLS activities, add BLE balance master when ready.    PT Home Exercise Plan  08/20/19: ankle PF with RTB,  ankle circles, ankle ABCs; 4/27:RTB DF, Inv/Ev, heel raise; 09/02/19:  gastroc stretch, SLS and tandem stance    Consulted and Agree with Plan of Care  Patient       Patient will benefit from skilled therapeutic intervention in  order to improve the following deficits and impairments:  Abnormal gait, Pain, Decreased mobility, Decreased activity tolerance, Decreased range of motion, Decreased strength, Hypomobility, Decreased balance, Difficulty walking, Impaired flexibility  Visit Diagnosis: Pain in left ankle and joints of left foot  Other abnormalities of gait and mobility     Problem List Patient Active Problem List   Diagnosis Date Noted  . Bipolar 1 disorder ([place]) 09/17/2019  . Schizoaffective disorder ([place]) 09/17/2019  . Foot pain, left 07/20/2019  . Incontinence overflow, stress female 12/29/2018  . Irritable bowel syndrome 12/29/2018  . Fx lateral malleolus-closed 05/07/2012    4:59 PM, 09/23/19 Josue Hector PT DPT  Physical Therapist with [facility]  [phone]   [facility] [address] [place], [place], [address] Phone: [phone]   Fax:  [phone]  Name: Sara Vance MRN: [id number] Date of Birth: 1976/01/14

## 2019-09-25 ENCOUNTER — Encounter (HOSPITAL_COMMUNITY): Payer: Self-pay | Admitting: Physical Therapy

## 2019-09-25 ENCOUNTER — Other Ambulatory Visit: Payer: Self-pay

## 2019-09-25 ENCOUNTER — Ambulatory Visit (HOSPITAL_COMMUNITY): Payer: PPO | Admitting: Physical Therapy

## 2019-09-25 DIAGNOSIS — R2689 Other abnormalities of gait and mobility: Secondary | ICD-10-CM

## 2019-09-25 DIAGNOSIS — M25572 Pain in left ankle and joints of left foot: Secondary | ICD-10-CM

## 2019-09-25 NOTE — Therapy (Signed)
[facility] [address] [place], [place], [address] Phone: [phone]   Fax:  [phone]  Physical Therapy Treatment  Patient Details  Name: Sara Vance MRN: [id number] Date of Birth: [birth year] Referring Provider (PT): Sanjuana Kava MD   Encounter Date: 09/25/2019  PT End of Session - 09/25/19 1348    Visit Number  9    Number of Visits  12    Date for PT Re-Evaluation  10/02/19    Authorization Type  [employer], no V.L. no auth    Progress Note Due on Visit  10    PT Start Time  [id number]    PT Stop Time  1423    PT Time Calculation (min)  38 min    Activity Tolerance  Patient tolerated treatment well    Behavior During Therapy  [facility] for tasks assessed/performed       Past Medical History:  Diagnosis Date  . Anxiety   . Bipolar 1 disorder ([place])   . IBS (irritable bowel syndrome)     Past Surgical History:  Procedure Laterality Date  . CHOLECYSTECTOMY    . ENDOMETRIAL ABLATION      There were no vitals filed for this visit.  Subjective Assessment - 09/25/19 1347    Subjective  Patient reported that she is doing well today but has some soreness in her ankle, but denied pain.    Patient Stated Goals  Want ankle not to hurt no more    Currently in Pain?  No/denies                        [facility] Adult PT Treatment/Exercise - 09/25/19 0001      Knee/Hip Exercises: Stretches   Gastroc Stretch  Both;3 reps;30 seconds    Gastroc Stretch Limitations  slant board      Knee/Hip Exercises: Cytogeneticist walkouts 30# x10           Balance Exercises - 09/25/19 1406      Balance Exercises: Standing   Balance Master: Limits for Stability  Level 8 1 minute x 2 with intermittent UE assist    Balance Beam  Forward stepping (not heel to toe) x 2 RT, sidestepping x 2RT     Tandem Gait  Forward;2 reps    Other Standing Exercises  lateral grapevines x 2RT 15'           PT Short Term Goals - 08/20/19 1740      PT SHORT TERM GOAL #1   Title  Patient will be independent with initial HEP to improve functional outcomes    Time  3    Period  Weeks    Status  New    Target Date  09/11/19      PT SHORT TERM GOAL #2   Title  Patient will report at least 50% overall improvement in subjective complaint to indicate improvement in ability to perform ADLs.    Time  3    Period  Weeks    Status  New    Target Date  09/11/19        PT Long Term Goals - 08/20/19 1740      PT LONG TERM GOAL #1   Title  Patient will improve FOTO score to <30% to indicate improvement in functional outcomes    Time  6    Period  Weeks    Status  New    Target Date  10/02/19      PT LONG TERM GOAL #2   Title  Patient will be able to maintain single limb stance >30 seconds on BLEs to improve stability and reduce risk for falls    Time  6    Period  Weeks    Status  New    Target Date  10/02/19      PT LONG TERM GOAL #3   Title  Patient will have equal to or > 4+/5 MMT throughout LT ankle to improve ability to perform functional mobility, stair ambulation and ADLs.    Time  6    Period  Weeks    Status  New    Target Date  10/02/19      PT LONG TERM GOAL #4   Title  Patient will report at least 75% overall improvement in subjective complaint to indicate improvement in ability to perform ADLs.    Time  6    Period  Weeks    Status  New    Target Date  10/02/19            Plan - 09/25/19 1429    Clinical Impression Statement  Focused on stability and balance exercises this session. Added Balance Master for limits of stability this session. Patient demonstrated ability to maintain the dot inside the inner-most circle for the majority of the time with occasional movement of the ball outside of the middle circle on the first attempt, on the second attempt, patient demonstrated decreased ability to maintain the ball in the inner-most circle. Patient did  demonstrate some unsteadiness with grapevine activity as well as forward walking on foam beam. Was able to cue patient for anterior ambulation on the beam to stand tall and noted an improvement in overall performance. Patient would benefit from continued therapy to address balance and stability deficits.    Examination-Activity Limitations  Lift;Stand;Locomotion Level;Bend;Transfers;Squat;Stairs;Hygiene/Grooming;Dressing    Examination-Participation Restrictions  Yard Work;Community Activity;Laundry;Volunteer;Cleaning    Stability/Clinical Decision Making  Stable/Uncomplicated    PT Frequency  2x / week    PT Duration  6 weeks    PT Treatment/Interventions  ADLs/Self Care Home Management;Aquatic Therapy;Biofeedback;Cryotherapy;Electrical Stimulation;Moist Heat;Traction;Parrafin;Fluidtherapy;Contrast Bath;Ultrasound;Therapeutic exercise;Orthotic Fit/Training;Compression bandaging;Manual lymph drainage;Patient/family education;Therapeutic activities;Manual techniques;Functional mobility training;Stair training;Neuromuscular re-education;Balance training;DME Instruction;Gait training;Iontophoresis 4mg /ml Dexamethasone;Passive range of motion;Dry needling;Energy conservation;Spinal Manipulations;Splinting;Joint Manipulations;Vasopneumatic Device;Taping    PT Next Visit Plan  Progress ankle stabilization and LLE strengthening as tolerated.  Progress Balance Master activities as able.    PT Home Exercise Plan  08/20/19: ankle PF with RTB, ankle circles, ankle ABCs; 4/27:RTB DF, Inv/Ev, heel raise; 09/02/19:  gastroc stretch, SLS and tandem stance    Consulted and Agree with Plan of Care  Patient       Patient will benefit from skilled therapeutic intervention in order to improve the following deficits and impairments:  Abnormal gait, Pain, Decreased mobility, Decreased activity tolerance, Decreased range of motion, Decreased strength, Hypomobility, Decreased balance, Difficulty walking, Impaired  flexibility  Visit Diagnosis: Pain in left ankle and joints of left foot  Other abnormalities of gait and mobility     Problem List Patient Active Problem List   Diagnosis Date Noted  . Bipolar 1 disorder ([place]) 09/17/2019  . Schizoaffective disorder ([place]) 09/17/2019  . Foot pain, left 07/20/2019  . Incontinence overflow, stress female 12/29/2018  . Irritable bowel syndrome 12/29/2018  . Fx lateral malleolus-closed 05/07/2012   Clarene Critchley PT, DPT 2:31 PM, 09/25/19 [phone]  [facility]  [place], [place], [address] Phone: [phone]   Fax:  [phone]  Name: Ashlyn R Hard MRN: [id number] Date of Birth: 05/26/1975

## 2019-09-29 ENCOUNTER — Encounter: Payer: Self-pay | Admitting: *Deleted

## 2019-09-29 ENCOUNTER — Other Ambulatory Visit (HOSPITAL_COMMUNITY): Payer: Self-pay | Admitting: Obstetrics & Gynecology

## 2019-09-29 ENCOUNTER — Telehealth: Payer: Self-pay | Admitting: Obstetrics & Gynecology

## 2019-09-29 ENCOUNTER — Other Ambulatory Visit: Payer: Self-pay | Admitting: Obstetrics & Gynecology

## 2019-09-29 DIAGNOSIS — N6489 Other specified disorders of breast: Secondary | ICD-10-CM

## 2019-09-29 NOTE — Telephone Encounter (Signed)
Left message with pt's mother to have pt return call @ 11:00 am. JSY

## 2019-09-29 NOTE — Telephone Encounter (Signed)
Radiology does that directly, they will schedule it also The only ones we ever have to schedule are from the [facility] in [place]

## 2019-09-29 NOTE — Telephone Encounter (Signed)
Pt had mammo and dx mammo last week was told she needed a breast bx and would hear from [place] about appt/ she was just following up to see when that was going to be.

## 2019-09-30 ENCOUNTER — Encounter (HOSPITAL_COMMUNITY): Payer: Self-pay | Admitting: Physical Therapy

## 2019-09-30 ENCOUNTER — Ambulatory Visit (HOSPITAL_COMMUNITY): Payer: PPO | Attending: Orthopaedic Surgery | Admitting: Physical Therapy

## 2019-09-30 ENCOUNTER — Other Ambulatory Visit: Payer: Self-pay

## 2019-09-30 DIAGNOSIS — M25572 Pain in left ankle and joints of left foot: Secondary | ICD-10-CM | POA: Diagnosis not present

## 2019-09-30 DIAGNOSIS — R2689 Other abnormalities of gait and mobility: Secondary | ICD-10-CM | POA: Insufficient documentation

## 2019-09-30 NOTE — Patient Instructions (Signed)
Access Code: [email or web address] URL: [email or web address] Date: 09/30/2019 Prepared by: Josue Hector  Exercises Squat with Chair Touch - 1 x daily - 4 x weekly - 2 sets - 10 reps Standing Hip Abduction - 1 x daily - 4 x weekly - 2 sets - 10 reps Standing Hip Extension - 1 x daily - 4 x weekly - 2 sets - 10 reps

## 2019-09-30 NOTE — Therapy (Signed)
[facility] [address] [place], [place], [address] Phone: [phone]   Fax:  [phone]  Physical Therapy Treatment/ Discharge Summary  Patient Details  Name: Sara Vance MRN: [id number] Date of Birth: Oct 22, 1975 Referring Provider (PT): Sanjuana Kava MD   Encounter Date: 09/30/2019   PHYSICAL THERAPY DISCHARGE SUMMARY  Visits from Start of Care: 10  Current functional level related to goals / functional outcomes: See below    Remaining deficits: See below   Education / Equipment: See assessment  Plan: Patient agrees to discharge.  Patient goals were partially met. Patient is being discharged due to being pleased with the current functional level.  ?????       PT End of Session - 09/30/19 1308    Visit Number  10    Number of Visits  12    Date for PT Re-Evaluation  10/02/19    Authorization Type  [employer] no [employer] no auth    Progress Note Due on Visit  10    PT Start Time  1300    PT Stop Time  1340    PT Time Calculation (min)  40 min    Activity Tolerance  Patient tolerated treatment well    Behavior During Therapy  WFL for tasks assessed/performed       Past Medical History:  Diagnosis Date  . Anxiety   . Bipolar 1 disorder ([place])   . IBS (irritable bowel syndrome)     Past Surgical History:  Procedure Laterality Date  . CHOLECYSTECTOMY    . ENDOMETRIAL ABLATION      There were no vitals filed for this visit.  Subjective Assessment - 09/30/19 1304    Subjective  Patient says she was doing her exercise on Monday and her foot got sore. Says she took yesterday off and it feels better. Say it still hurts some when she moves it. Standing on one leg is the hardest. Says she feels about 95% improvement overall since starting therapy.    Limitations  Lifting;Standing;Walking;House hold activities    How long can you stand comfortably?  15 min    How long can you walk comfortably?  20 min    Diagnostic tests  xray    Patient Stated Goals  Want ankle not to hurt no more    Currently in Pain?  Yes    Pain Score  3     Pain Location  Ankle    Pain Orientation  Left;Medial    Pain Descriptors / Indicators  Sore    Pain Type  Acute pain    Pain Onset  More than a month ago    Pain Frequency  Intermittent    Aggravating Factors   prolonged standing, prolonged walking    Pain Relieving Factors  rest, rub, ice    Effect of Pain on Daily Activities  Limits         [facility] PT Assessment - 09/30/19 0001      Assessment   Medical Diagnosis  LT ankle sprain    Referring Provider (PT)  Sanjuana Kava MD    Onset Date/Surgical Date  07/17/19    Next MD Visit  10/08/19    Prior Therapy  Not for ankle       Precautions   Precautions  None      Restrictions   Weight Bearing Restrictions  No      Balance Screen   Has the patient fallen in the past  6 months  Yes    How many times?  2    Has the patient had a decrease in activity level because of a fear of falling?   No    Is the patient reluctant to leave their home because of a fear of falling?   No      Home Film/video editor residence      Prior Function   Level of Independence  Independent      Cognition   Overall Cognitive Status  Within Functional Limits for tasks assessed      Observation/Other Assessments   Focus on Therapeutic Outcomes (FOTO)   23% limited    was 47%     Strength   Left Ankle Dorsiflexion  5/5   was 4+   Left Ankle Plantar Flexion  5/5   was 4+   Left Ankle Inversion  4+/5   was 4, still with mod discomfort    Left Ankle Eversion  5/5   was 4     Static Standing Balance   Static Standing Balance -  Activities   Single Leg Stance - Right Leg;Single Leg Stance - Left Leg    Static Standing - Comment/# of Minutes  26 sec; 5 sec                             PT Education - 09/30/19 1308    Education Details  on reassessment findings and  transition to DC. HEP handout    Person(s) Educated  Patient    Methods  Explanation;Handout    Comprehension  Verbalized understanding       PT Short Term Goals - 09/30/19 1326      PT SHORT TERM GOAL #1   Title  Patient will be independent with initial HEP to improve functional outcomes    Baseline  Reports compliance    Time  3    Period  Weeks    Status  Achieved    Target Date  09/11/19      PT SHORT TERM GOAL #2   Title  Patient will report at least 50% overall improvement in subjective complaint to indicate improvement in ability to perform ADLs.    Baseline  Reports 95%    Time  3    Period  Weeks    Status  Achieved    Target Date  09/11/19        PT Long Term Goals - 09/30/19 1327      PT LONG TERM GOAL #1   Title  Patient will improve FOTO score to <30% to indicate improvement in functional outcomes    Baseline  23% currently    Time  6    Period  Weeks    Status  Achieved      PT LONG TERM GOAL #2   Title  Patient will be able to maintain single limb stance >30 seconds on BLEs to improve stability and reduce risk for falls    Baseline  Can do 30 sec on RT, 5 sec on LT    Time  6    Period  Weeks    Status  Partially Met      PT LONG TERM GOAL #3   Title  Patient will have equal to or > 4+/5 MMT throughout LT ankle to improve ability to perform functional mobility, stair ambulation and ADLs.    Baseline  See MMT    Time  6    Period  Weeks    Status  Partially Met      PT LONG TERM GOAL #4   Title  Patient will report at least 75% overall improvement in subjective complaint to indicate improvement in ability to perform ADLs.    Baseline  Reports 95%    Time  6    Period  Weeks    Status  Achieved            Plan - 09/30/19 1339    Clinical Impression Statement  Patient has made good progress toward therapy goals. Patient currently with all short- and long-term goals met except single limb standing. Patient continues to have difficulty and  pain with single leg stand for prolonged period on LLE. Patient slightly more limited with this at reassessment today due to increased activity over weekend and ankle being slightly more sore to begin session. Patient shows improved strength, and mobility otherwise, and shows improved tolerance to functional activity and is now able to walk farther distance and go for walks outside without increased reported pain or episodes of instability. At this time patient will be DC form therapy to transition to home program. Patient educated on and issued updated HEP handout. Patient instructed to follow up with therapy services with any further questions or concerns.    Examination-Activity Limitations  Lift;Stand;Locomotion Level;Bend;Transfers;Squat;Stairs;Hygiene/Grooming;Dressing    Examination-Participation Restrictions  Yard Work;Community Activity;Laundry;Volunteer;Cleaning    Stability/Clinical Decision Making  Stable/Uncomplicated    PT Treatment/Interventions  ADLs/Self Care Home Management;Aquatic Therapy;Biofeedback;Cryotherapy;Electrical Stimulation;Moist Heat;Traction;Parrafin;Fluidtherapy;Contrast Bath;Ultrasound;Therapeutic exercise;Orthotic Fit/Training;Compression bandaging;Manual lymph drainage;Patient/family education;Therapeutic activities;Manual techniques;Functional mobility training;Stair training;Neuromuscular re-education;Balance training;DME Instruction;Gait training;Iontophoresis 54m/ml Dexamethasone;Passive range of motion;Dry needling;Energy conservation;Spinal Manipulations;Splinting;Joint Manipulations;Vasopneumatic Device;Taping    PT Next Visit Plan  DC to HEP    PT Home Exercise Plan  08/20/19: ankle PF with RTB, ankle circles, ankle ABCs; 4/27:RTB DF, Inv/Ev, heel raise; 09/02/19:  gastroc stretch, SLS and tandem stance    Consulted and Agree with Plan of Care  Patient       Patient will benefit from skilled therapeutic intervention in order to improve the following deficits and  impairments:  Abnormal gait, Pain, Decreased mobility, Decreased activity tolerance, Decreased range of motion, Decreased strength, Hypomobility, Decreased balance, Difficulty walking, Impaired flexibility  Visit Diagnosis: Pain in left ankle and joints of left foot  Other abnormalities of gait and mobility     Problem List Patient Active Problem List   Diagnosis Date Noted  . Bipolar 1 disorder ([place] 09/17/2019  . Schizoaffective disorder ([place] 09/17/2019  . Foot pain, left 07/20/2019  . Incontinence overflow, stress female 12/29/2018  . Irritable bowel syndrome 12/29/2018  . Fx lateral malleolus-closed 05/07/2012   1:44 PM, 09/30/19 CJosue HectorPT DPT  Physical [facility]rap[facility] [phone]  [facility] [address]Phone: [phone]  Fax:  [phone] Name: Sara Vance MRN: [id number] of Birth: [birth year]

## 2019-10-02 ENCOUNTER — Encounter (HOSPITAL_COMMUNITY): Payer: PPO | Admitting: Physical Therapy

## 2019-10-05 ENCOUNTER — Ambulatory Visit
Admission: RE | Admit: 2019-10-05 | Discharge: 2019-10-05 | Disposition: A | Discharge status: Home / Self Care | Payer: PPO | Source: Ambulatory Visit | Attending: Obstetrics & Gynecology | Admitting: Obstetrics & Gynecology

## 2019-10-05 ENCOUNTER — Encounter: Payer: Self-pay | Admitting: General Practice

## 2019-10-05 ENCOUNTER — Other Ambulatory Visit: Payer: Self-pay

## 2019-10-05 DIAGNOSIS — N6489 Other specified disorders of breast: Secondary | ICD-10-CM

## 2019-10-05 DIAGNOSIS — N6011 Diffuse cystic mastopathy of right breast: Secondary | ICD-10-CM | POA: Diagnosis not present

## 2019-10-05 DIAGNOSIS — R928 Other abnormal and inconclusive findings on diagnostic imaging of breast: Secondary | ICD-10-CM | POA: Diagnosis not present

## 2019-10-08 ENCOUNTER — Ambulatory Visit: Payer: PPO | Admitting: Orthopaedic Surgery

## 2019-10-08 ENCOUNTER — Ambulatory Visit: Payer: No Typology Code available for payment source | Admitting: Internal Medicine

## 2019-10-14 DIAGNOSIS — Z961 Presence of intraocular lens: Secondary | ICD-10-CM | POA: Diagnosis not present

## 2019-10-15 ENCOUNTER — Ambulatory Visit: Payer: PPO | Admitting: Orthopaedic Surgery

## 2019-10-15 ENCOUNTER — Encounter: Payer: Self-pay | Admitting: Orthopaedic Surgery

## 2019-10-15 ENCOUNTER — Other Ambulatory Visit: Payer: Self-pay

## 2019-10-15 VITALS — BP 138/76 | HR 82 | Ht 64.0 in | Wt 170.0 lb

## 2019-10-15 DIAGNOSIS — M25572 Pain in left ankle and joints of left foot: Secondary | ICD-10-CM

## 2019-10-15 NOTE — Progress Notes (Signed)
My foot and ankle are OK now  She has finished PT and has done well.  She has no limp, no pain, ROM is full, NV intact.  Encounter Diagnosis  Name Primary?  . Pain in left ankle and joints of left foot Yes   Discharge.  Call if any problem.  Precautions discussed.   Electronically Signed Sanjuana Kava, MD 6/17/202110:47 AM

## 2019-11-13 DIAGNOSIS — F3181 Bipolar II disorder: Secondary | ICD-10-CM | POA: Diagnosis not present

## 2019-11-13 DIAGNOSIS — F900 Attention-deficit hyperactivity disorder, predominantly inattentive type: Secondary | ICD-10-CM | POA: Diagnosis not present

## 2019-12-03 ENCOUNTER — Ambulatory Visit: Payer: PPO | Admitting: Orthopaedic Surgery

## 2019-12-08 ENCOUNTER — Ambulatory Visit: Payer: PPO

## 2019-12-08 ENCOUNTER — Encounter: Payer: Self-pay | Admitting: Orthopaedic Surgery

## 2019-12-08 ENCOUNTER — Ambulatory Visit: Payer: PPO | Admitting: Orthopaedic Surgery

## 2019-12-08 ENCOUNTER — Other Ambulatory Visit: Payer: Self-pay

## 2019-12-08 VITALS — BP 124/82 | HR 87 | Ht 64.0 in | Wt 163.0 lb

## 2019-12-08 DIAGNOSIS — M25572 Pain in left ankle and joints of left foot: Secondary | ICD-10-CM

## 2019-12-08 NOTE — Progress Notes (Signed)
Patient Sara Vance, female DOB:[birth year], 44 y.o. GHW:[id number]  Chief Complaint  Patient presents with  . Ankle Pain    Lt ankle pain    HPI  Sara Vance is a 44 y.o. female who has increasing pain of the left ankle after working out in [employer] this week.  She has had left ankle pain treated here earlier this summer.  She has no redness, no swelling but medial pain.   Body mass index is 27.98 kg/m.  ROS  Review of Systems  Constitutional: Positive for activity change.  Musculoskeletal: Positive for arthralgias, gait problem and joint swelling.  Psychiatric/Behavioral: The patient is nervous/anxious.   All other systems reviewed and are negative.   All other systems reviewed and are negative.  The following is a summary of the past history medically, past history surgically, known current medicines, social history and family history.  This information is gathered electronically by the computer from prior information and documentation.  I review this each visit and have found including this information at this point in the chart is beneficial and informative.    Past Medical History:  Diagnosis Date  . Anxiety   . Bipolar 1 disorder ([place])   . IBS (irritable bowel syndrome)     Past Surgical History:  Procedure Laterality Date  . CHOLECYSTECTOMY    . ENDOMETRIAL ABLATION      Family History  Problem Relation Age of Onset  . Heart disease Other   . Cancer Other   . Arthritis Other   . Diabetes Other   . Stroke Maternal Grandmother   . Cancer Father   . Crohn's disease Mother   . Cerebral palsy Brother   . ADD / ADHD Daughter     Social History Social History   Tobacco Use  . Smoking status: Never Smoker  . Smokeless tobacco: Never Used  Vaping Use  . Vaping Use: Never used  Substance Use Topics  . Alcohol use: No  . Drug use: No    Allergies  Allergen Reactions  . Latex Itching    Bleeding, Redness    Current Outpatient Medications   Medication Sig Dispense Refill  . acetaminophen (TYLENOL) 500 MG tablet Take 500 mg by mouth every 6 (six) hours as needed. For headache    . amphetamine-dextroamphetamine (ADDERALL) 20 MG tablet Take 20 mg by mouth daily.      . ARIPiprazole (ABILIFY) 10 MG tablet Take 10 mg by mouth daily.    Marland Kitchen buPROPion (WELLBUTRIN XL) 150 MG 24 hr tablet Take 450 mg by mouth daily.      . clonazePAM (KLONOPIN) 1 MG tablet Take 1 mg by mouth at bedtime.    . gabapentin (NEURONTIN) 800 MG tablet Take 1,200 mg by mouth 2 (two) times daily. 400 in the mornings 800 at bedtime    . ibuprofen (ADVIL,MOTRIN) 200 MG tablet Take 400 mg by mouth every 6 (six) hours as needed. For pain    . perphenazine (TRILAFON) 16 MG tablet Take 16 mg by mouth daily.     . traZODone (DESYREL) 150 MG tablet Take 300 mg by mouth at bedtime.      No current facility-administered medications for this visit.     Physical Exam  Blood pressure 124/82, pulse 87, height 5\' 4"  (1.626 m), weight 163 lb (73.9 kg).  Constitutional: overall normal hygiene, normal nutrition, well developed, normal grooming, normal body habitus. Assistive device:none  Musculoskeletal: gait and station Limp left, muscle tone and  strength are normal, no tremors or atrophy is present.  .  Neurological: coordination overall normal.  Deep tendon reflex/nerve stretch intact.  Sensation normal.  Cranial nerves II-XII intact.   Skin:   Normal overall no scars, lesions, ulcers or rashes. No psoriasis.  Psychiatric: Alert and oriented x 3.  Recent memory intact, remote memory unclear.  Normal mood and affect. Well groomed.  Good eye contact.  Cardiovascular: overall no swelling, no varicosities, no edema bilaterally, normal temperatures of the legs and arms, no clubbing, cyanosis and good capillary refill.  Lymphatic: palpation is normal.  Left ankle with no swelling, no redness, ROM is full but painful.  Very tender over the medial malleolus, not posteriorly.   NV intact.  Limp left.  All other systems reviewed and are negative   The patient has been educated about the nature of the problem(s) and counseled on treatment options.  The patient appeared to understand what I have discussed and is in agreement with it.  Encounter Diagnosis  Name Primary?  . Pain in left ankle and joints of left foot Yes   X-rays were done of the left ankle, reported separately.  PLAN Call if any problems.  Precautions discussed.  Continue current medications.   Return to clinic 3 weeks   Use Aspercreme, Biofreeze or Voltaren Gel.  Electronically Signed Sanjuana Kava, MD 8/10/20213:47 PM

## 2019-12-15 ENCOUNTER — Encounter: Payer: Self-pay | Admitting: Internal Medicine

## 2019-12-15 ENCOUNTER — Other Ambulatory Visit: Payer: Self-pay

## 2019-12-15 ENCOUNTER — Ambulatory Visit (INDEPENDENT_AMBULATORY_CARE_PROVIDER_SITE_OTHER): Payer: PPO | Admitting: Internal Medicine

## 2019-12-15 VITALS — BP 108/78 | HR 78 | Temp 98.6°F | Wt 161.6 lb

## 2019-12-15 DIAGNOSIS — F319 Bipolar disorder, unspecified: Secondary | ICD-10-CM

## 2019-12-15 NOTE — Progress Notes (Signed)
Note created in error.  Domingo Mend, MD [facility]

## 2019-12-17 DIAGNOSIS — F251 Schizoaffective disorder, depressive type: Secondary | ICD-10-CM | POA: Diagnosis not present

## 2019-12-29 ENCOUNTER — Ambulatory Visit: Payer: No Typology Code available for payment source | Admitting: Family Medicine

## 2019-12-31 ENCOUNTER — Encounter: Payer: Self-pay | Admitting: Orthopaedic Surgery

## 2019-12-31 ENCOUNTER — Other Ambulatory Visit: Payer: Self-pay

## 2019-12-31 ENCOUNTER — Ambulatory Visit (INDEPENDENT_AMBULATORY_CARE_PROVIDER_SITE_OTHER): Payer: PPO | Admitting: Orthopaedic Surgery

## 2019-12-31 VITALS — Ht 64.0 in | Wt 159.0 lb

## 2019-12-31 DIAGNOSIS — M25572 Pain in left ankle and joints of left foot: Secondary | ICD-10-CM

## 2019-12-31 NOTE — Progress Notes (Signed)
I am better  She has less pain to the left ankle, no swelling.  She has been using Voltaren Gel and it helps.  She is walking well. ROM is full.  NV intact.  Encounter Diagnosis  Name Primary?  . Pain in left ankle and joints of left foot Yes   I will see as needed.  Call if any problem.  Precautions discussed.   Electronically Signed Sanjuana Kava, MD 9/2/202110:43 AM

## 2020-01-18 ENCOUNTER — Other Ambulatory Visit: Payer: Self-pay

## 2020-01-19 ENCOUNTER — Ambulatory Visit (INDEPENDENT_AMBULATORY_CARE_PROVIDER_SITE_OTHER): Payer: PPO | Admitting: Internal Medicine

## 2020-01-19 ENCOUNTER — Other Ambulatory Visit: Payer: Self-pay

## 2020-01-19 ENCOUNTER — Encounter: Payer: Self-pay | Admitting: Internal Medicine

## 2020-01-19 VITALS — BP 110/70 | HR 78 | Temp 98.4°F | Ht 63.0 in | Wt 158.0 lb

## 2020-01-19 DIAGNOSIS — Z23 Encounter for immunization: Secondary | ICD-10-CM

## 2020-01-19 DIAGNOSIS — F319 Bipolar disorder, unspecified: Secondary | ICD-10-CM

## 2020-01-19 DIAGNOSIS — N393 Stress incontinence (female) (male): Secondary | ICD-10-CM

## 2020-01-19 DIAGNOSIS — K589 Irritable bowel syndrome without diarrhea: Secondary | ICD-10-CM | POA: Diagnosis not present

## 2020-01-19 DIAGNOSIS — Z0000 Encounter for general adult medical examination without abnormal findings: Secondary | ICD-10-CM

## 2020-01-19 DIAGNOSIS — N39490 Overflow incontinence: Secondary | ICD-10-CM | POA: Diagnosis not present

## 2020-01-19 NOTE — Patient Instructions (Signed)
-Nice seeing you today!!  -Lab work today; will notify you once results are available.  -Flu and tetanus vaccines today.  -referral to GI and urology will be requested today.  -Schedule follow up in 6 months.   Preventive Care 28-46 Years Old, Female Preventive care refers to visits with your health care provider and lifestyle choices that can promote health and wellness. This includes:  A yearly physical exam. This may also be called an annual well check.  Regular dental visits and eye exams.  Immunizations.  Screening for certain conditions.  Healthy lifestyle choices, such as eating a healthy diet, getting regular exercise, not using drugs or products that contain nicotine and tobacco, and limiting alcohol use. What can I expect for my preventive care visit? Physical exam Your health care provider will check your:  Height and weight. This may be used to calculate body mass index (BMI), which tells if you are at a healthy weight.  Heart rate and blood pressure.  Skin for abnormal spots. Counseling Your health care provider may ask you questions about your:  Alcohol, tobacco, and drug use.  Emotional well-being.  Home and relationship well-being.  Sexual activity.  Eating habits.  Work and work Statistician.  Method of birth control.  Menstrual cycle.  Pregnancy history. What immunizations do I need?  Influenza (flu) vaccine  This is recommended every year. Tetanus, diphtheria, and pertussis (Tdap) vaccine  You may need a Td booster every 10 years. Varicella (chickenpox) vaccine  You may need this if you have not been vaccinated. Zoster (shingles) vaccine  You may need this after age 29. Measles, mumps, and rubella (MMR) vaccine  You may need at least one dose of MMR if you were born in 1957 or later. You may also need a second dose. Pneumococcal conjugate (PCV13) vaccine  You may need this if you have certain conditions and were not previously  vaccinated. Pneumococcal polysaccharide (PPSV23) vaccine  You may need one or two doses if you smoke cigarettes or if you have certain conditions. Meningococcal conjugate (MenACWY) vaccine  You may need this if you have certain conditions. Hepatitis A vaccine  You may need this if you have certain conditions or if you travel or work in places where you may be exposed to hepatitis A. Hepatitis B vaccine  You may need this if you have certain conditions or if you travel or work in places where you may be exposed to hepatitis B. Haemophilus influenzae type b (Hib) vaccine  You may need this if you have certain conditions. Human papillomavirus (HPV) vaccine  If recommended by your health care provider, you may need three doses over 6 months. You may receive vaccines as individual doses or as more than one vaccine together in one shot (combination vaccines). Talk with your health care provider about the risks and benefits of combination vaccines. What tests do I need? Blood tests  Lipid and cholesterol levels. These may be checked every 5 years, or more frequently if you are over 82 years old.  Hepatitis C test.  Hepatitis B test. Screening  Lung cancer screening. You may have this screening every year starting at age 74 if you have a 30-pack-year history of smoking and currently smoke or have quit within the past 15 years.  Colorectal cancer screening. All adults should have this screening starting at age 17 and continuing until age 31. Your health care provider may recommend screening at age 39 if you are at increased risk. You will  have tests every 1-10 years, depending on your results and the type of screening test.  Diabetes screening. This is done by checking your blood sugar (glucose) after you have not eaten for a while (fasting). You may have this done every 1-3 years.  Mammogram. This may be done every 1-2 years. Talk with your health care provider about when you should start  having regular mammograms. This may depend on whether you have a family history of breast cancer.  BRCA-related cancer screening. This may be done if you have a family history of breast, ovarian, tubal, or peritoneal cancers.  Pelvic exam and Pap test. This may be done every 3 years starting at age 89. Starting at age 73, this may be done every 5 years if you have a Pap test in combination with an HPV test. Other tests  Sexually transmitted disease (STD) testing.  Bone density scan. This is done to screen for osteoporosis. You may have this scan if you are at high risk for osteoporosis. Follow these instructions at home: Eating and drinking  Eat a diet that includes fresh fruits and vegetables, whole grains, lean protein, and low-fat dairy.  Take vitamin and mineral supplements as recommended by your health care provider.  Do not drink alcohol if: ? Your health care provider tells you not to drink. ? You are pregnant, may be pregnant, or are planning to become pregnant.  If you drink alcohol: ? Limit how much you have to 0-1 drink a day. ? Be aware of how much alcohol is in your drink. In the [place] one drink equals one 12 oz bottle of beer (355 mL), one 5 oz glass of wine (148 mL), or one 1 oz glass of hard liquor (44 mL). Lifestyle  Take daily care of your teeth and gums.  Stay active. Exercise for at least 30 minutes on 5 or more days each week.  Do not use any products that contain nicotine or tobacco, such as cigarettes, e-cigarettes, and chewing tobacco. If you need help quitting, ask your health care provider.  If you are sexually active, practice safe sex. Use a condom or other form of birth control (contraception) in order to prevent pregnancy and STIs (sexually transmitted infections).  If told by your health care provider, take low-dose aspirin daily starting at age 80. What's next?  Visit your health care provider once a year for a well check visit.  Ask your health  care provider how often you should have your eyes and teeth checked.  Stay up to date on all vaccines. This information is not intended to replace advice given to you by your health care provider. Make sure you discuss any questions you have with your health care provider. Document Revised: 12/26/2017 Document Reviewed: 12/26/2017 Elsevier Patient Education  2020 [employer].

## 2020-01-19 NOTE — Addendum Note (Signed)
Addended by: Westley Hummer B on: 01/19/2020 10:20 AM   Modules accepted: Orders

## 2020-01-19 NOTE — Progress Notes (Signed)
Established Patient Office Visit     This visit occurred during the SARS-CoV-2 public health emergency.  Safety protocols were in place, including screening questions prior to the visit, additional usage of staff PPE, and extensive cleaning of exam room while observing appropriate contact time as indicated for disinfecting solutions.    CC/Reason for Visit: Annual preventive exam  HPI: Sara Vance is a 44 y.o. female who is coming in today for the above mentioned reasons. Past Medical History is significant for: Schizoaffective disorder bipolar type followed closely by psychiatry without recent medication changes.  She has routine eye and dental care.  She walks routinely, she is fully vaccinated against Covid, she is due for flu and Tdap vaccinations.  She has a GYN and had Pap smear and mammogram over the summer.  She needs GI referral for screening colonoscopy.  She has chronic stress/overflow incontinence and is requesting referral to urology today.  Otherwise she is feeling well without complaints.   Past Medical/Surgical History: Past Medical History:  Diagnosis Date  . Anxiety   . Bipolar 1 disorder ([place])   . IBS (irritable bowel syndrome)     Past Surgical History:  Procedure Laterality Date  . CHOLECYSTECTOMY    . ENDOMETRIAL ABLATION      Social History:  reports that she has never smoked. She has never used smokeless tobacco. She reports that she does not drink alcohol and does not use drugs.  Allergies: Allergies  Allergen Reactions  . Latex Itching    Bleeding, Redness    Family History:  Family History  Problem Relation Age of Onset  . Heart disease Other   . Cancer Other   . Arthritis Other   . Diabetes Other   . Stroke Maternal Grandmother   . Cancer Father   . Crohn's disease Mother   . Cerebral palsy Brother   . ADD / ADHD Daughter      Current Outpatient Medications:  .  acetaminophen (TYLENOL) 500 MG tablet, Take 500 mg by mouth  every 6 (six) hours as needed. For headache, Disp: , Rfl:  .  amphetamine-dextroamphetamine (ADDERALL) 20 MG tablet, Take 20 mg by mouth daily.  , Disp: , Rfl:  .  ARIPiprazole (ABILIFY) 10 MG tablet, Take 10 mg by mouth daily., Disp: , Rfl:  .  buPROPion (WELLBUTRIN XL) 150 MG 24 hr tablet, Take 450 mg by mouth daily.  , Disp: , Rfl:  .  clonazePAM (KLONOPIN) 1 MG tablet, Take 1 mg by mouth at bedtime., Disp: , Rfl:  .  gabapentin (NEURONTIN) 800 MG tablet, Take 1,200 mg by mouth 2 (two) times daily. 400 in the mornings 800 at bedtime, Disp: , Rfl:  .  ibuprofen (ADVIL,MOTRIN) 200 MG tablet, Take 400 mg by mouth every 6 (six) hours as needed. For pain, Disp: , Rfl:  .  perphenazine (TRILAFON) 16 MG tablet, Take 16 mg by mouth daily. , Disp: , Rfl:  .  traZODone (DESYREL) 150 MG tablet, Take 300 mg by mouth at bedtime. , Disp: , Rfl:   Review of Systems:  Constitutional: Denies fever, chills, diaphoresis, appetite change and fatigue.  HEENT: Denies photophobia, eye pain, redness, hearing loss, ear pain, congestion, sore throat, rhinorrhea, sneezing, mouth sores, trouble swallowing, neck pain, neck stiffness and tinnitus.   Respiratory: Denies SOB, DOE, cough, chest tightness,  and wheezing.   Cardiovascular: Denies chest pain, palpitations and leg swelling.  Gastrointestinal: Denies nausea, vomiting, abdominal pain, diarrhea, constipation, blood  in stool and abdominal distention.  Genitourinary: Denies dysuria, urgency, frequency, hematuria, flank pain and difficulty urinating.  Endocrine: Denies: hot or cold intolerance, sweats, changes in hair or nails, polyuria, polydipsia. Musculoskeletal: Denies myalgias, back pain, joint swelling, arthralgias and gait problem.  Skin: Denies pallor, rash and wound.  Neurological: Denies dizziness, seizures, syncope, weakness, light-headedness, numbness and headaches.  Hematological: Denies adenopathy. Easy bruising, personal or family bleeding history    Psychiatric/Behavioral: Denies suicidal ideation, mood changes, confusion, nervousness, sleep disturbance and agitation    Physical Exam: Vitals:   01/19/20 0946  BP: 110/70  Pulse: 78  Temp: 98.4 F (36.9 C)  TempSrc: Oral  SpO2: 98%  Weight: 158 lb (71.7 kg)  Height: 5' 3" (1.6 m)    Body mass index is 27.99 kg/m.   Constitutional: NAD, calm, comfortable Eyes: PERRL, lids and conjunctivae normal ENMT: Mucous membranes are moist.Tympanic membrane is pearly white, no erythema or bulging. Neck: normal, supple, no masses, no thyromegaly Respiratory: clear to auscultation bilaterally, no wheezing, no crackles. Normal respiratory effort. No accessory muscle use.  Cardiovascular: Regular rate and rhythm, no murmurs / rubs / gallops. No extremity edema. 2+ pedal pulses. No carotid bruits.  Abdomen: no tenderness, no masses palpated. No hepatosplenomegaly. Bowel sounds positive.  Musculoskeletal: no clubbing / cyanosis. No joint deformity upper and lower extremities. Good ROM, no contractures. Normal muscle tone.  Skin: no rashes, lesions, ulcers. No induration Neurologic: CN 2-12 grossly intact. Sensation intact, DTR normal. Strength 5/5 in all 4.  Psychiatric: Normal judgment and insight. Alert and oriented x 3. Normal mood.    Impression and Plan:  Encounter for preventive health examination  -She has routine eye and dental care. -Flu and Tdap boosters today, otherwise immunizations are up-to-date including Covid. -Screening labs today. -Healthy lifestyle discussed in detail. -She is overdue for screening colonoscopy, I will refer her to GI. -She had mammogram and Pap smear in [place] 21.  Bipolar 1 disorder (HCC) -Mood is stable, followed by psychiatry.  Incontinence overflow, stress female -Referral to urology per patient request.  Need for influenza vaccination -Flu vaccine administered today.  Need for tetanus booster -Tdap administered today.   Patient  Instructions  -Nice seeing you today!!  -Lab work today; will notify you once results are available.  -Flu and tetanus vaccines today.  -referral to GI and urology will be requested today.  -Schedule follow up in 6 months.   Preventive Care 62-71 Years Old, Female Preventive care refers to visits with your health care provider and lifestyle choices that can promote health and wellness. This includes:  A yearly physical exam. This may also be called an annual well check.  Regular dental visits and eye exams.  Immunizations.  Screening for certain conditions.  Healthy lifestyle choices, such as eating a healthy diet, getting regular exercise, not using drugs or products that contain nicotine and tobacco, and limiting alcohol use. What can I expect for my preventive care visit? Physical exam Your health care provider will check your:  Height and weight. This may be used to calculate body mass index (BMI), which tells if you are at a healthy weight.  Heart rate and blood pressure.  Skin for abnormal spots. Counseling Your health care provider may ask you questions about your:  Alcohol, tobacco, and drug use.  Emotional well-being.  Home and relationship well-being.  Sexual activity.  Eating habits.  Work and work Statistician.  Method of birth control.  Menstrual cycle.  Pregnancy history. What immunizations  do I need?  Influenza (flu) vaccine  This is recommended every year. Tetanus, diphtheria, and pertussis (Tdap) vaccine  You may need a Td booster every 10 years. Varicella (chickenpox) vaccine  You may need this if you have not been vaccinated. Zoster (shingles) vaccine  You may need this after age 15. Measles, mumps, and rubella (MMR) vaccine  You may need at least one dose of MMR if you were born in 1957 or later. You may also need a second dose. Pneumococcal conjugate (PCV13) vaccine  You may need this if you have certain conditions and were  not previously vaccinated. Pneumococcal polysaccharide (PPSV23) vaccine  You may need one or two doses if you smoke cigarettes or if you have certain conditions. Meningococcal conjugate (MenACWY) vaccine  You may need this if you have certain conditions. Hepatitis A vaccine  You may need this if you have certain conditions or if you travel or work in places where you may be exposed to hepatitis A. Hepatitis B vaccine  You may need this if you have certain conditions or if you travel or work in places where you may be exposed to hepatitis B. Haemophilus influenzae type b (Hib) vaccine  You may need this if you have certain conditions. Human papillomavirus (HPV) vaccine  If recommended by your health care provider, you may need three doses over 6 months. You may receive vaccines as individual doses or as more than one vaccine together in one shot (combination vaccines). Talk with your health care provider about the risks and benefits of combination vaccines. What tests do I need? Blood tests  Lipid and cholesterol levels. These may be checked every 5 years, or more frequently if you are over 63 years old.  Hepatitis C test.  Hepatitis B test. Screening  Lung cancer screening. You may have this screening every year starting at age 48 if you have a 30-pack-year history of smoking and currently smoke or have quit within the past 15 years.  Colorectal cancer screening. All adults should have this screening starting at age 20 and continuing until age 66. Your health care provider may recommend screening at age 69 if you are at increased risk. You will have tests every 1-10 years, depending on your results and the type of screening test.  Diabetes screening. This is done by checking your blood sugar (glucose) after you have not eaten for a while (fasting). You may have this done every 1-3 years.  Mammogram. This may be done every 1-2 years. Talk with your health care provider about when  you should start having regular mammograms. This may depend on whether you have a family history of breast cancer.  BRCA-related cancer screening. This may be done if you have a family history of breast, ovarian, tubal, or peritoneal cancers.  Pelvic exam and Pap test. This may be done every 3 years starting at age 60. Starting at age 53, this may be done every 5 years if you have a Pap test in combination with an HPV test. Other tests  Sexually transmitted disease (STD) testing.  Bone density scan. This is done to screen for osteoporosis. You may have this scan if you are at high risk for osteoporosis. Follow these instructions at home: Eating and drinking  Eat a diet that includes fresh fruits and vegetables, whole grains, lean protein, and low-fat dairy.  Take vitamin and mineral supplements as recommended by your health care provider.  Do not drink alcohol if: ? Your health care provider  tells you not to drink. ? You are pregnant, may be pregnant, or are planning to become pregnant.  If you drink alcohol: ? Limit how much you have to 0-1 drink a day. ? Be aware of how much alcohol is in your drink. In the [place] one drink equals one 12 oz bottle of beer (355 mL), one 5 oz glass of wine (148 mL), or one 1 oz glass of hard liquor (44 mL). Lifestyle  Take daily care of your teeth and gums.  Stay active. Exercise for at least 30 minutes on 5 or more days each week.  Do not use any products that contain nicotine or tobacco, such as cigarettes, e-cigarettes, and chewing tobacco. If you need help quitting, ask your health care provider.  If you are sexually active, practice safe sex. Use a condom or other form of birth control (contraception) in order to prevent pregnancy and STIs (sexually transmitted infections).  If told by your health care provider, take low-dose aspirin daily starting at age 38. What's next?  Visit your health care provider once a year for a well check  visit.  Ask your health care provider how often you should have your eyes and teeth checked.  Stay up to date on all vaccines. This information is not intended to replace advice given to you by your health care provider. Make sure you discuss any questions you have with your health care provider. Document Revised: 12/26/2017 Document Reviewed: 12/26/2017 Elsevier Patient Education  2020 [place], MD [facility]

## 2020-01-20 LAB — COMPREHENSIVE METABOLIC PANEL
AG Ratio: 1.8 (calc) (ref 1.0–2.5)
ALT: 13 U/L (ref 6–29)
AST: 15 U/L (ref 10–35)
Albumin: 4.1 g/dL (ref 3.6–5.1)
Alkaline phosphatase (APISO): 66 U/L (ref 31–125)
BUN: 8 mg/dL (ref 7–25)
CO2: 28 mmol/L (ref 20–32)
Calcium: 9.3 mg/dL (ref 8.6–10.2)
Chloride: 101 mmol/L (ref 98–110)
Creat: 0.86 mg/dL (ref 0.50–1.10)
Globulin: 2.3 g/dL (calc) (ref 1.9–3.7)
Glucose, Bld: 83 mg/dL (ref 65–99)
Potassium: 4.2 mmol/L (ref 3.5–5.3)
Sodium: 138 mmol/L (ref 135–146)
Total Bilirubin: 0.9 mg/dL (ref 0.2–1.2)
Total Protein: 6.4 g/dL (ref 6.1–8.1)

## 2020-01-20 LAB — VITAMIN B12: Vitamin B-12: 425 pg/mL (ref 200–1100)

## 2020-01-20 LAB — CBC WITH DIFFERENTIAL/PLATELET
Absolute Monocytes: 561 cells/uL (ref 200–950)
Basophils Absolute: 62 cells/uL (ref 0–200)
Basophils Relative: 0.7 %
Eosinophils Absolute: 107 cells/uL (ref 15–500)
Eosinophils Relative: 1.2 %
HCT: 40 % (ref 35.0–45.0)
Hemoglobin: 13.3 g/dL (ref 11.7–15.5)
Lymphs Abs: 2038 cells/uL (ref 850–3900)
MCH: 31.7 pg (ref 27.0–33.0)
MCHC: 33.3 g/dL (ref 32.0–36.0)
MCV: 95.5 fL (ref 80.0–100.0)
MPV: 10.3 fL (ref 7.5–12.5)
Monocytes Relative: 6.3 %
Neutro Abs: 6132 cells/uL (ref 1500–7800)
Neutrophils Relative %: 68.9 %
Platelets: 355 10*3/uL (ref 140–400)
RBC: 4.19 10*6/uL (ref 3.80–5.10)
RDW: 11.3 % (ref 11.0–15.0)
Total Lymphocyte: 22.9 %
WBC: 8.9 10*3/uL (ref 3.8–10.8)

## 2020-01-20 LAB — VITAMIN D 25 HYDROXY (VIT D DEFICIENCY, FRACTURES): Vit D, 25-Hydroxy: 86 ng/mL (ref 30–100)

## 2020-01-20 LAB — HEMOGLOBIN A1C
Hgb A1c MFr Bld: 4.6 % of total Hgb (ref <5.7)
Mean Plasma Glucose: 85 (calc)
eAG (mmol/L): 4.7 (calc)

## 2020-01-20 LAB — LIPID PANEL
Cholesterol: 134 mg/dL (ref <200)
HDL: 43 mg/dL — ABNORMAL LOW (ref >=50)
LDL Cholesterol (Calc): 75 mg/dL (calc)
Non-HDL Cholesterol (Calc): 91 mg/dL (calc) (ref <130)
Total CHOL/HDL Ratio: 3.1 (calc) (ref <5.0)
Triglycerides: 76 mg/dL (ref <150)

## 2020-01-20 LAB — TSH: TSH: 1.26 mIU/L

## 2020-01-21 ENCOUNTER — Encounter: Payer: Self-pay | Admitting: Internal Medicine

## 2020-02-23 ENCOUNTER — Ambulatory Visit: Payer: PPO | Admitting: Urology

## 2020-02-23 NOTE — Progress Notes (Deleted)
H&P  Chief Complaint: ***  History of Present Illness: Sara Vance is a 44 y.o. year old female ***  Past Medical History:  Diagnosis Date  . Anxiety   . Bipolar 1 disorder ([place])   . IBS (irritable bowel syndrome)     Past Surgical History:  Procedure Laterality Date  . CHOLECYSTECTOMY    . ENDOMETRIAL ABLATION      Home Medications:  (Not in a hospital admission)   Allergies:  Allergies  Allergen Reactions  . Latex Itching    Bleeding, Redness    Family History  Problem Relation Age of Onset  . Heart disease Other   . Cancer Other   . Arthritis Other   . Diabetes Other   . Stroke Maternal Grandmother   . Cancer Father   . Crohn's disease Mother   . Cerebral palsy Brother   . ADD / ADHD Daughter     Social History:  reports that she has never smoked. She has never used smokeless tobacco. She reports that she does not drink alcohol and does not use drugs.  ROS: A complete review of systems was performed.  All systems are negative except for pertinent findings as noted.  Physical Exam:  Vital signs in last 24 hours: @VSRANGES @ General:  Alert and oriented, No acute distress HEENT: Normocephalic, atraumatic Neck: No JVD or lymphadenopathy Cardiovascular: Regular rate  Lungs: Normal inspiratory/expiratory excursion Abdomen: Soft, nontender, nondistended, no abdominal masses Back: No CVA tenderness Extremities: No edema Neurologic: Grossly intact  I have reviewed prior pt notes  I have reviewed notes from referring/previous physicians  I have reviewed urinalysis results  I have independently reviewed prior imaging    Impression/Assessment:  ***  Plan:  ***  Lillette Boxer Jaydien Panepinto 02/23/2020, 12:41 PM  Lillette Boxer. Yamira Papa MD
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
## 2020-03-04 ENCOUNTER — Ambulatory Visit: Payer: PPO | Admitting: Gastroenterology

## 2020-03-04 ENCOUNTER — Encounter: Payer: Self-pay | Admitting: Gastroenterology

## 2020-03-04 ENCOUNTER — Other Ambulatory Visit: Payer: Self-pay

## 2020-03-04 DIAGNOSIS — R1310 Dysphagia, unspecified: Secondary | ICD-10-CM

## 2020-03-04 DIAGNOSIS — K5900 Constipation, unspecified: Secondary | ICD-10-CM | POA: Diagnosis not present

## 2020-03-04 NOTE — Patient Instructions (Signed)
Please start Benefiber or Metamucil daily.  Please also start MiraLAX 1 capful (17 g) daily in ounces of water.  If you develop frequent loose stools, you may decrease the frequency of MiraLAX.  We will continue to monitor your swallowing difficulties.  Be sure to eat slowly, take small bites, chew thoroughly, and drink plenty of liquids with meals.  If something were to get hung in your esophagus and not come up or go down, you should proceed to the emergency room.  We will plan to see you back in the office in 3 months.  Please call in 2-4 weeks with a progress report.    It was nice meeting you today!  Aliene Altes, PA-C [facility]

## 2020-03-04 NOTE — Assessment & Plan Note (Addendum)
44 y.o. female with chronic history of constipation that is not well managed. She is not taking anything on a regular basis to help with this.  No abdominal pain, BRBPR, or melena.  She has intentionally lost about 70 pounds over the last 11 months with significant dietary changes.  No family history of colon cancer.  She is interested in a colonoscopy if her insurance will pay for one before age 89. On exam, she has very mild TTP in LLQ and RLQ which I suspect is related to constipation.   Plan:  Start Benefiber or Metamucil daily. Start MiraLAX 1 capful (17 g) daily in ounces water. She can decrease MiraLAX frequency if she develops frequently loose stools. Advise she call her insurance company to inquire if they will cover a screening colonoscopy before age 27. Requested progress report in 2-4 weeks. Follow-up in 3 months.
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
## 2020-03-09 NOTE — Progress Notes (Signed)
CC'ED TO PCP 

## 2020-03-16 DIAGNOSIS — F900 Attention-deficit hyperactivity disorder, predominantly inattentive type: Secondary | ICD-10-CM | POA: Diagnosis not present

## 2020-03-16 DIAGNOSIS — F251 Schizoaffective disorder, depressive type: Secondary | ICD-10-CM | POA: Diagnosis not present

## 2020-04-12 ENCOUNTER — Telehealth: Payer: Self-pay

## 2020-04-12 DIAGNOSIS — R928 Other abnormal and inconclusive findings on diagnostic imaging of breast: Secondary | ICD-10-CM

## 2020-04-12 NOTE — Telephone Encounter (Signed)
Pt requesting order placed for mammo based off of biopsy results from 6 months ago

## 2020-04-13 NOTE — Telephone Encounter (Signed)
Called and left message that I had placed orders for the mammogram for her.

## 2020-04-26 ENCOUNTER — Telehealth (INDEPENDENT_AMBULATORY_CARE_PROVIDER_SITE_OTHER): Payer: PPO | Admitting: Family Medicine

## 2020-04-26 ENCOUNTER — Encounter: Payer: Self-pay | Admitting: Family Medicine

## 2020-04-26 VITALS — Ht 64.0 in | Wt 150.0 lb

## 2020-04-26 DIAGNOSIS — R059 Cough, unspecified: Secondary | ICD-10-CM

## 2020-04-26 DIAGNOSIS — J029 Acute pharyngitis, unspecified: Secondary | ICD-10-CM | POA: Diagnosis not present

## 2020-04-26 NOTE — Progress Notes (Signed)
Virtual Visit via Telephone Note  I connected with Sara Vance on 04/26/20 at  4:20 PM EST by telephone and verified that I am speaking with the correct person using two identifiers.   I discussed the limitations, risks, security and privacy concerns of performing an evaluation and management service by telephone and the availability of in person appointments. I also discussed with the patient that there may be a patient responsible charge related to this service. The patient expressed understanding and agreed to proceed.  Location patient: home, [place] Location provider: work or home office Participants present for the call: patient, provider Patient did not have a visit with me in the prior 7 days to address this/these issue(s).   History of Present Illness:  Acute telemedicine visit for a sore throat: -Onset: yesterday  -Symptoms include: sore throat, congestion, cough, chills at times -T is 98 degrees currently -Denies: SOB, CP, NVD, inability to get out of bed/eat or drink -Has tried: tylenol, chloraseptic spray -Pertinent past medical history:see chart -Pertinent medication allergies: -COVID-19 vaccine status: fully vaccinated but has not booster; had flu shot   Observations/Objective: Patient sounds cheerful and well on the phone. I do not appreciate any SOB. Speech and thought processing are grossly intact. Patient reported vitals:  Assessment and Plan:  Sore throat  Cough  -we discussed possible serious and likely etiologies, options for evaluation and workup, limitations of telemedicine visit vs in person visit, treatment, treatment risks and precautions. Pt prefers to treat via telemedicine empirically rather than in person at this moment.  Query viral upper respiratory illness versus Covid versus other.  Opted for Covid testing, discussed options, and symptomatic care with nasal saline, salt water gargles, warm liquids with honey and lemon, analgesics if needed.  She  did not feel that she needed a cough medication at this time.  Discussed potential complications and precautions.  Advised to seek prompt in person care if worsening, new symptoms arise, or if is not improving with treatment. Advised of options for inperson care in case PCP office not available. Did let the patient know that I only do telemedicine shifts for [facility] on Tuesdays and Thursdays and advised a follow up visit with PCP or at an [facility] if has further questions or concerns.   Follow Up Instructions:  I did not refer this patient for an OV with me in the next 24 hours for this/these issue(s).  I discussed the assessment and treatment plan with the patient. The patient was provided an opportunity to ask questions and all were answered. The patient agreed with the plan and demonstrated an understanding of the instructions.   I spent 12 minutes on this encounter.   Terressa Koyanagi, DO

## 2020-04-26 NOTE — Patient Instructions (Signed)
  HOME CARE TIPS:  -[place] COVID19 testing information: [email or web address] [place] [phone] Most pharmacies also offer testing and home test kits.  -can use tylenol  if needed per instructions for fevers, aches and pains per instructions  -can use nasal saline a few times per day if nasal congestion, sometime a short course of Afrin nasal spray for 3 days can help as well  -stay hydrated, drink plenty of fluids and eat small healthy meals - avoid dairy  -If the Covid test is positive, check out the CDC website for more information on isolation, home care, transmission and treatment for COVID19  -follow up with your doctor in 2-3 days unless improving and feeling better  -stay home while sick, except to seek medical care, and if you have COVID19 please stay home for a full 10 days since the onset of symptoms PLUS one day of no fever and feeling better.  It was nice to meet you today, and I really hope you are feeling better soon. I help [facility] out with telemedicine visits on Tuesdays and Thursdays and am available for visits on those days. If you have any concerns or questions following this visit please schedule a follow up visit with your Primary Care doctor or seek care at a local urgent care clinic to avoid delays in care.    Seek in person care promptly if your symptoms worsen, new concerns arise or you are not improving with treatment. Call 911 and/or seek emergency care if you symptoms are severe or life threatening.

## 2020-05-02 ENCOUNTER — Ambulatory Visit: Payer: Self-pay

## 2020-05-03 ENCOUNTER — Encounter: Payer: Self-pay | Admitting: Emergency Medicine

## 2020-05-03 ENCOUNTER — Ambulatory Visit
Admission: EM | Admit: 2020-05-03 | Discharge: 2020-05-03 | Disposition: A | Discharge status: Home / Self Care | Payer: Medicare Other | Attending: Family Medicine | Admitting: Family Medicine

## 2020-05-03 DIAGNOSIS — R059 Cough, unspecified: Secondary | ICD-10-CM | POA: Diagnosis not present

## 2020-05-03 DIAGNOSIS — J040 Acute laryngitis: Secondary | ICD-10-CM

## 2020-05-03 DIAGNOSIS — J069 Acute upper respiratory infection, unspecified: Secondary | ICD-10-CM

## 2020-05-03 DIAGNOSIS — R519 Headache, unspecified: Secondary | ICD-10-CM

## 2020-05-03 DIAGNOSIS — B349 Viral infection, unspecified: Secondary | ICD-10-CM

## 2020-05-03 DIAGNOSIS — R5383 Other fatigue: Secondary | ICD-10-CM | POA: Diagnosis not present

## 2020-05-03 DIAGNOSIS — Z7689 Persons encountering health services in other specified circumstances: Secondary | ICD-10-CM | POA: Diagnosis not present

## 2020-05-03 MED ORDER — FLUCONAZOLE 150 MG PO TABS: ORAL_TABLET | ORAL | 0 refills | Status: DC

## 2020-05-03 MED ORDER — AMOXICILLIN-POT CLAVULANATE 875-125 MG PO TABS: 1.0000 | ORAL_TABLET | Freq: Two times a day (BID) | ORAL | 0 refills | Status: AC

## 2020-05-03 NOTE — Discharge Instructions (Addendum)
I have sent in Augmentin for you to take twice a day for 7 days. If your viral testing is negative, get this filled and complete  I have sent in fluconazole in case of yeast. Take one tablet at the onset of symptoms. If symptoms are still present in 3 days, take the second tablet.   Your COVID and Flu tests are pending.  You should self quarantine until the test results are back.    Take Tylenol or ibuprofen as needed for fever or discomfort.  Rest and keep yourself hydrated.    Follow-up with your primary care provider if your symptoms are not improving.
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
## 2020-05-03 NOTE — ED Triage Notes (Signed)
C/o congestion cough, laryngitis, and dental pain.  Coughing up yellow colored mucus.

## 2020-05-04 ENCOUNTER — Other Ambulatory Visit: Payer: Self-pay

## 2020-05-04 LAB — COVID-19, FLU A+B NAA
Influenza A, NAA: NOT DETECTED
Influenza B, NAA: NOT DETECTED
SARS-CoV-2, NAA: NOT DETECTED
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
## 2020-05-19 ENCOUNTER — Ambulatory Visit: Payer: PPO | Admitting: Urology
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
## 2020-06-10 ENCOUNTER — Ambulatory Visit: Payer: PPO | Admitting: Gastroenterology

## 2020-06-10 ENCOUNTER — Other Ambulatory Visit: Payer: Self-pay

## 2020-06-10 ENCOUNTER — Encounter: Payer: Self-pay | Admitting: Gastroenterology

## 2020-06-10 VITALS — BP 114/72 | HR 88 | Temp 96.8°F | Ht 64.0 in | Wt 149.0 lb

## 2020-06-10 DIAGNOSIS — R1310 Dysphagia, unspecified: Secondary | ICD-10-CM | POA: Diagnosis not present

## 2020-06-10 DIAGNOSIS — K5900 Constipation, unspecified: Secondary | ICD-10-CM | POA: Diagnosis not present

## 2020-06-10 NOTE — Patient Instructions (Signed)
Increase Benefiber to 3 teaspoons twice daily.  Try setting an alarm on your phone to help you remember to take Benefiber.   If needed, you may also add MiraLAX 1 capful (17 g) in 8 ounces of water.   Please check with your insurance company to see if they will cover a screening colonoscopy between age 47-50.  If so, please let us know and we can get you scheduled.  We will plan to follow-up with you in 6 months.  Do not hesitate to call with questions or concerns prior.  It was great to see you today!  Aliene Altes, PA-C [facility]
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
## 2020-06-10 NOTE — Assessment & Plan Note (Addendum)
45 year old female with chronic history of constipation, no alarm symptoms.  When she remembers to take 3.5 teaspoons of Benefiber daily, she has BMs every other day, but continues to feel incomplete.  Notes if she forgets to take Benefiber, she will take Benefiber and MiraLAX together, and has a good bowel movement.  She has never had a colonoscopy.  No family history of colon cancer.  She is interested in a colonoscopy if her insurance will pay for it before age 50.  Plan: 1.  Increase Benefiber to 3 teaspoons twice daily every day.  Advised she set an alarm on her phone to help her remember to take Benefiber. 2.  If Benefiber alone does not control constipation well, may add in MiraLAX 1 capful (17 g) daily in 8 ounces of water. 3.  Advised she call her insurance company to inquire if they will cover a screening colonoscopy before age 62.  If so, she can let us know and we will get her scheduled. 4.  Follow-up in 6 months.  Advised to call with questions or concerns prior.

## 2020-06-30 ENCOUNTER — Ambulatory Visit: Payer: Medicare Other | Admitting: Urology

## 2020-06-30 DIAGNOSIS — N393 Stress incontinence (female) (male): Secondary | ICD-10-CM

## 2020-08-11 ENCOUNTER — Other Ambulatory Visit: Payer: Self-pay

## 2020-08-11 ENCOUNTER — Ambulatory Visit (INDEPENDENT_AMBULATORY_CARE_PROVIDER_SITE_OTHER): Payer: Medicare Other | Admitting: Urology

## 2020-08-11 ENCOUNTER — Encounter: Payer: Self-pay | Admitting: Urology

## 2020-08-11 VITALS — BP 110/69 | HR 80 | Temp 98.6°F | Ht 64.0 in | Wt 153.0 lb

## 2020-08-11 DIAGNOSIS — R32 Unspecified urinary incontinence: Secondary | ICD-10-CM | POA: Diagnosis not present

## 2020-08-11 DIAGNOSIS — N814 Uterovaginal prolapse, unspecified: Secondary | ICD-10-CM

## 2020-08-11 DIAGNOSIS — R351 Nocturia: Secondary | ICD-10-CM | POA: Diagnosis not present

## 2020-08-11 DIAGNOSIS — N3946 Mixed incontinence: Secondary | ICD-10-CM | POA: Diagnosis not present

## 2020-08-11 DIAGNOSIS — R350 Frequency of micturition: Secondary | ICD-10-CM | POA: Diagnosis not present

## 2020-08-11 LAB — URINALYSIS, ROUTINE W REFLEX MICROSCOPIC
Bilirubin, UA: NEGATIVE
Glucose, UA: NEGATIVE
Ketones, UA: NEGATIVE
Leukocytes,UA: NEGATIVE
Nitrite, UA: NEGATIVE
Protein,UA: NEGATIVE
RBC, UA: NEGATIVE
Specific Gravity, UA: 1.01 (ref 1.005–1.030)
Urobilinogen, Ur: 0.2 mg/dL (ref 0.2–1.0)
pH, UA: 7 (ref 5.0–7.5)
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
## 2020-08-11 NOTE — Progress Notes (Signed)
post void residual=75  Urological Symptom Review  Patient is experiencing the following symptoms: Frequent urination Hard to postpone urination Get up at night to urinate Leakage of urine Stream starts and stops Have to strain to urinate   Review of Systems  Gastrointestinal (upper)  : Negative for upper GI symptoms  Gastrointestinal (lower) : Constipation  Constitutional : Negative for symptoms  Skin: Negative for skin symptoms  Eyes: Negative for eye symptoms  Ear/Nose/Throat : Negative for Ear/Nose/Throat symptoms  Hematologic/Lymphatic: Negative for Hematologic/Lymphatic symptoms  Cardiovascular : Negative for cardiovascular symptoms  Respiratory : Negative for respiratory symptoms  Endocrine: negative  Musculoskeletal: Negative for musculoskeletal symptoms  Neurological: Dizziness  Psychologic: Depression Anxiety

## 2020-09-14 DIAGNOSIS — N393 Stress incontinence (female) (male): Secondary | ICD-10-CM | POA: Diagnosis not present

## 2020-09-22 ENCOUNTER — Encounter: Payer: Self-pay | Admitting: *Deleted

## 2020-09-22 ENCOUNTER — Telehealth: Payer: Self-pay | Admitting: *Deleted

## 2020-09-22 NOTE — Telephone Encounter (Signed)
Manuela Schwartz please schedule triage with Angie. Thanks

## 2020-09-22 NOTE — Telephone Encounter (Signed)
NV made for 10/13/2020 and letter mailed

## 2020-09-22 NOTE — Telephone Encounter (Signed)
Noted. Her procedure would be with propofol, ASA II category. I discussed with Tretha Sciara. Patient likely would not be scheduled until August. As she has not been seen since February, I recommend she come to see Angie for a triage visit to ensure her medications are up to date and there have been no major health problems since her last visit.   If she is having any major GI problems or alarm symptoms such as rectal bleeding or black stools she will need an OV rather than triage. She had no major problems when I saw her in February.

## 2020-09-22 NOTE — Telephone Encounter (Signed)
Received VM from pt. She spoke with insurance and they will cover TCS for her. She wants to get set up.

## 2020-10-04 DIAGNOSIS — N393 Stress incontinence (female) (male): Secondary | ICD-10-CM | POA: Diagnosis not present

## 2020-10-13 ENCOUNTER — Other Ambulatory Visit: Payer: Self-pay

## 2020-10-13 ENCOUNTER — Ambulatory Visit (INDEPENDENT_AMBULATORY_CARE_PROVIDER_SITE_OTHER): Payer: Self-pay | Admitting: *Deleted

## 2020-10-13 VITALS — Ht 64.0 in | Wt 156.6 lb

## 2020-10-13 DIAGNOSIS — Z1211 Encounter for screening for malignant neoplasm of colon: Secondary | ICD-10-CM

## 2020-10-13 MED ORDER — PEG 3350-KCL-NA BICARB-NACL 420 G PO SOLR: 4000.0000 mL | Freq: Once | ORAL | 0 refills | Status: AC

## 2020-10-13 NOTE — Progress Notes (Signed)
Ok to schedule with propofol with Dr. Gala Romney.  ASA II.

## 2020-10-13 NOTE — Progress Notes (Signed)
Pt made aware that I will call her once Dr. Roseanne Kaufman procedure schedules for August are available.

## 2020-10-13 NOTE — Progress Notes (Addendum)
Gastroenterology Pre-Procedure Review  Request Date: 10/13/2020 Requesting Physician: Triage per Aliene Altes, PA-C, last ov 06/10/2020, Last TCS done at age 45 by Dr. Laural Golden per pt, no polyps per pt  PATIENT REVIEW QUESTIONS: The patient responded to the following health history questions as indicated:    1. Diabetes Melitis: no 2. Joint replacements in the past 12 months: no 3. Major health problems in the past 3 months: no 4. Has an artificial valve or MVP: no 5. Has a defibrillator: no 6. Has been advised in past to take antibiotics in advance of a procedure like teeth cleaning: no 7. Family history of colon cancer: no 8. Alcohol Use: no 9. Illicit drug Use: no 10. History of sleep apnea: no 11. History of coronary artery or other vascular stents placed within the last 12 months: no 12. History of any prior anesthesia complications: no 13. Body mass index is 26.88 kg/m.    MEDICATIONS & ALLERGIES:    Patient reports the following regarding taking any blood thinners:   Plavix? no Aspirin? no Coumadin? no Brilinta? no Xarelto? no Eliquis? no Pradaxa? no Savaysa? no Effient? no  Patient confirms/reports the following medications:  Current Outpatient Medications  Medication Sig Dispense Refill   acetaminophen (TYLENOL) 500 MG tablet Take 500 mg by mouth as needed. For headache     amphetamine-dextroamphetamine (ADDERALL) 30 MG tablet Take 1 tablet by mouth daily.     ARIPiprazole (ABILIFY) 10 MG tablet Take 5 mg by mouth daily.      BIOTIN PO Take by mouth daily.     buPROPion (WELLBUTRIN XL) 150 MG 24 hr tablet Take 450 mg by mouth daily.     CALCIUM PO Take by mouth daily.     Cholecalciferol (VITAMIN D3 PO) Take by mouth daily.     clonazePAM (KLONOPIN) 1 MG tablet Take 1 mg by mouth at bedtime.     gabapentin (NEURONTIN) 400 MG capsule Take 400 mg by mouth 2 (two) times daily.     ibuprofen (ADVIL,MOTRIN) 200 MG tablet Take 400 mg by mouth as needed. For pain      Multiple Vitamins-Minerals (MULTIVITAMIN WOMEN) TABS Take by mouth daily.     perphenazine (TRILAFON) 8 MG tablet Take by mouth at bedtime. Takes 3 tablets at bedtime.     polyethylene glycol (MIRALAX / GLYCOLAX) 17 g packet Take 17 g by mouth daily as needed.     traZODone (DESYREL) 150 MG tablet Take 150 mg by mouth at bedtime.     No current facility-administered medications for this visit.    Patient confirms/reports the following allergies:  Allergies  Allergen Reactions   Latex Itching    Bleeding, Redness    No orders of the defined types were placed in this encounter.   AUTHORIZATION INFORMATION Primary Insurance: BC[employer] Medicare,  [place] #: [id number],  Group #: [id number] Pre-Cert / Auth required: No, not required   SCHEDULE INFORMATION: Procedure has been scheduled as follows:  Date: 01/23/2021, Time:  8:45 Location: [facility] with Dr. Gala Romney  This Gastroenterology Pre-Precedure Review Form is being routed to the following provider(s): Aliene Altes, PA

## 2020-10-13 NOTE — Patient Instructions (Addendum)
Sara Vance   [birth year] MRN: [id number] Procedure Date: 01/23/2021 Arrival Time: 7:45 am    Location of Procedure: [facility]  PREPARATION FOR COLONOSCOPY WITH TRI-LYTE PREP   Please notify us immediately if you are diabetic, take iron supplements, or if you are on Coumadin or any other blood thinners.   Please hold the following medications: n/a  You will need to purchase 1 fleet enema and 1 box of Bisacodyl 5mg  tablets.   PROCEDURE IS SCHEDULED FOR Sara Vance AS FOLLOWS:  Procedure Date: 01/23/2021 Time to register: 7:45 am Place to register: [facility] Scheduled provider: Dr. Gala Romney   2 DAYS BEFORE PROCEDURE:  DATE: 01/21/2021   DAY: Saturday Begin clear liquid diet AFTER your lunch meal. NO SOLID FOODS!   1 DAY BEFORE PROCEDURE:  DATE: 01/22/2021   DAY: Sunday  Continue clear liquids the entire day - NO SOLID FOOD.   Diabetic medications adjustments for today: n/a  At 12:00pm (noon): Take 2 (two) Dulcolax (Bisacodyl) tablets  At 2:00pm: Start drinking your solution. Try to drink 1 (one) 8 ounce glass every 10-15 minutes, until you have consumed HALF the jug. (You should complete the first 1/2 of the jug in 2 hours. Wait 30 minutes, then drink 3-4 more glasses of the solution. Your stools should be clear; if not, you may have to consume the rest of the jug.   One hour after completing the solution: take the last 2 (two) Dulcolax (Bisacodyl) tablets, with a clear liquid.  YOU MUST DRINK PLENTY OF CLEAR LIQUIDS DURING YOUR PREP TO REDUCE RISKS OF KIDNEY FAILURE.   Continue clear liquids only, until midnight. Do not eat or drink anything after midnight.  EXCEPTION:  If you take medications for your heart, blood pressure or breathing, you may take these medications with a small amount of clear liquid.      DAY OF PROCEDURE:   DATE: 01/23/2021    DAY: Monday The morning of your procedure give yourself 1 (one) Fleet Enema, at least 1 hour before  going to the hospital.   You may take Tylenol products. Please continue your regular medications unless we have instructed otherwise.   Diabetic medications adjustments for today. N/a  Someone MUST be available to drive you home; the hospital will cancel this appointment if you do not have a driver.   Please call the office if you have any questions (Dept: [phone]).  Please see below for Dietary Information.  CLEAR LIQUIDS INCLUDE:  Water Jello (NOT red in color)   Ice Popsicles (NOT red in color)   Tea (sugar ok, no milk/cream) Powdered fruit flavored drinks  Coffee (sugar ok, no milk/cream) Gatorade/ Lemonade/ Kool-Aid  (NOT red in color)   Juice: apple, white grape, white cranberry Soft drinks  Clear bullion, consomme, broth (fat free beef/chicken/vegetable)  Carbonated beverages (any kind)  Strained chicken noodle soup Hard Candy   REMEMBER: Clear liquids are liquids that will allow you to see your fingers on the other side of a clear glass. Be sure liquids are NOT red in color, and not cloudy, but CLEAR.   DO NOT EAT OR DRINK ANY OF THE FOLLOWING:  Dairy products of any kind   Cranberry juice Tomato juice / V8 juice   Grapefruit juice Orange juice     Red grape juice  Do not eat any solid foods, including such foods as: cereal, oatmeal, yogurt, fruits, vegetables, creamed soups, eggs, bread, etc.    HELPFUL  HINTS FOR DRINKING PREP SOLUTION:  Make sure prep is extremely cold. Refrigerate the night before. You may also put in the freezer.  You may try mixing some Crystal Light or Country Time Lemonade if you prefer. Mix in small amounts; add more if necessary. Try drinking through a straw Rinse mouth with water or a mouthwash between glasses, to remove after-taste. Try sipping on a cold beverage /ice/ popsicles between glasses of prep Place a piece of sugar-free hard candy in mouth between glasses If you become nauseated, try consuming smaller amounts, or stretch out  the time between glasses. Stop for 30-60 minutes, then slowly start back drinking    You may call the office (Dept: [phone]) before 5:00pm, or page the doctor on call after 5:00pm ([phone]), for further instructions, if necessary.   OTHER INSTRUCTIONS  You will need a responsible adult at least 47 years of age to accompany you and drive you home. This person must remain in the waiting room during your procedure.  Wear loose fitting clothing that is easily removed.  Leave jewelry and other valuables at home.   Remove all body piercing jewelry and leave at home.  Total time from sign-in until discharge is approximately 2-3 hours.  You should go home directly after your procedure and rest. You can resume normal activities the day after your procedure.  The day of your procedure you should not: Drive Make legal decisions Operate machinery Drink alcohol Return to work

## 2020-11-01 DIAGNOSIS — Z01419 Encounter for gynecological examination (general) (routine) without abnormal findings: Secondary | ICD-10-CM | POA: Diagnosis not present

## 2020-11-09 ENCOUNTER — Telehealth: Payer: Self-pay | Admitting: Internal Medicine

## 2020-11-09 NOTE — Telephone Encounter (Signed)
[phone] patient called to schedule her tcs

## 2020-11-10 NOTE — Telephone Encounter (Signed)
Lmom for pt to me back.

## 2020-11-11 NOTE — Telephone Encounter (Signed)
Pt made aware that I will call her once Sept procedure schedules have been released.

## 2020-11-25 ENCOUNTER — Other Ambulatory Visit: Payer: Self-pay | Admitting: *Deleted

## 2020-11-25 DIAGNOSIS — Z1211 Encounter for screening for malignant neoplasm of colon: Secondary | ICD-10-CM

## 2020-11-25 NOTE — Progress Notes (Signed)
Pt informed to have labs drawn on 01/20/2021.

## 2020-11-28 ENCOUNTER — Other Ambulatory Visit: Payer: Self-pay

## 2020-11-29 ENCOUNTER — Ambulatory Visit (INDEPENDENT_AMBULATORY_CARE_PROVIDER_SITE_OTHER): Payer: Medicare Other | Admitting: Internal Medicine

## 2020-11-29 ENCOUNTER — Encounter: Payer: Self-pay | Admitting: Internal Medicine

## 2020-11-29 VITALS — BP 120/80 | HR 69 | Temp 97.8°F | Ht 64.0 in | Wt 157.4 lb

## 2020-11-29 DIAGNOSIS — F250 Schizoaffective disorder, bipolar type: Secondary | ICD-10-CM | POA: Diagnosis not present

## 2020-11-29 DIAGNOSIS — K589 Irritable bowel syndrome without diarrhea: Secondary | ICD-10-CM | POA: Diagnosis not present

## 2020-11-29 DIAGNOSIS — Z0000 Encounter for general adult medical examination without abnormal findings: Secondary | ICD-10-CM

## 2020-11-29 DIAGNOSIS — N393 Stress incontinence (female) (male): Secondary | ICD-10-CM

## 2020-11-29 LAB — COMPREHENSIVE METABOLIC PANEL
ALT: 19 U/L (ref 0–35)
AST: 17 U/L (ref 0–37)
Albumin: 4.2 g/dL (ref 3.5–5.2)
Alkaline Phosphatase: 58 U/L (ref 39–117)
BUN: 9 mg/dL (ref 6–23)
CO2: 31 mEq/L (ref 19–32)
Calcium: 9.4 mg/dL (ref 8.4–10.5)
Chloride: 101 mEq/L (ref 96–112)
Creatinine, Ser: 0.88 mg/dL (ref 0.40–1.20)
GFR: 78.54 mL/min (ref >60.00)
Glucose, Bld: 74 mg/dL (ref 70–99)
Potassium: 4 mEq/L (ref 3.5–5.1)
Sodium: 139 mEq/L (ref 135–145)
Total Bilirubin: 0.8 mg/dL (ref 0.2–1.2)
Total Protein: 6.6 g/dL (ref 6.0–8.3)

## 2020-11-29 LAB — TSH: TSH: 0.85 u[IU]/mL (ref 0.35–5.50)

## 2020-11-29 LAB — CBC WITH DIFFERENTIAL/PLATELET
Basophils Absolute: 0 10*3/uL (ref 0.0–0.1)
Basophils Relative: 0.5 % (ref 0.0–3.0)
Eosinophils Absolute: 0.1 10*3/uL (ref 0.0–0.7)
Eosinophils Relative: 1 % (ref 0.0–5.0)
HCT: 39.5 % (ref 36.0–46.0)
Hemoglobin: 13.3 g/dL (ref 12.0–15.0)
Lymphocytes Relative: 31.9 % (ref 12.0–46.0)
Lymphs Abs: 2.5 10*3/uL (ref 0.7–4.0)
MCHC: 33.7 g/dL (ref 30.0–36.0)
MCV: 93 fl (ref 78.0–100.0)
Monocytes Absolute: 0.5 10*3/uL (ref 0.1–1.0)
Monocytes Relative: 5.7 % (ref 3.0–12.0)
Neutro Abs: 4.8 10*3/uL (ref 1.4–7.7)
Neutrophils Relative %: 60.9 % (ref 43.0–77.0)
Platelets: 337 10*3/uL (ref 150.0–400.0)
RBC: 4.25 Mil/uL (ref 3.87–5.11)
RDW: 12.3 % (ref 11.5–15.5)
WBC: 7.9 10*3/uL (ref 4.0–10.5)

## 2020-11-29 LAB — VITAMIN D 25 HYDROXY (VIT D DEFICIENCY, FRACTURES): VITD: 51.44 ng/mL (ref 30.00–100.00)

## 2020-11-29 LAB — HEMOGLOBIN A1C: Hgb A1c MFr Bld: 4.9 % (ref 4.6–6.5)

## 2020-11-29 LAB — LIPID PANEL
Cholesterol: 170 mg/dL (ref 0–200)
HDL: 68.5 mg/dL (ref >39.00)
LDL Cholesterol: 87 mg/dL (ref 0–99)
NonHDL: 101.44
Total CHOL/HDL Ratio: 2
Triglycerides: 70 mg/dL (ref 0.0–149.0)
VLDL: 14 mg/dL (ref 0.0–40.0)

## 2020-11-29 LAB — VITAMIN B12: Vitamin B-12: 401 pg/mL (ref 211–911)
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
## 2020-11-29 NOTE — Addendum Note (Signed)
Addended by: Amanda Cockayne on: 11/29/2020 01:32 PM   Modules accepted: Orders

## 2020-11-29 NOTE — Patient Instructions (Signed)
-  Nice seeing you today!!  -Lab work today; will notify you once results are available.  -Remember to schedule your mammogram.  -Schedule follow up in 1 year or sooner as needed.

## 2020-12-07 DIAGNOSIS — H524 Presbyopia: Secondary | ICD-10-CM | POA: Diagnosis not present

## 2020-12-08 ENCOUNTER — Ambulatory Visit: Payer: Medicare Other | Admitting: Gastroenterology

## 2020-12-21 DIAGNOSIS — L718 Other rosacea: Secondary | ICD-10-CM | POA: Diagnosis not present

## 2020-12-21 DIAGNOSIS — L218 Other seborrheic dermatitis: Secondary | ICD-10-CM | POA: Diagnosis not present

## 2021-01-20 ENCOUNTER — Other Ambulatory Visit (HOSPITAL_COMMUNITY)
Admission: RE | Admit: 2021-01-20 | Discharge: 2021-01-20 | Disposition: A | Discharge status: Home / Self Care | Payer: Medicare Other | Source: Ambulatory Visit | Attending: Internal Medicine | Admitting: Internal Medicine

## 2021-01-20 DIAGNOSIS — Z1211 Encounter for screening for malignant neoplasm of colon: Secondary | ICD-10-CM | POA: Insufficient documentation

## 2021-01-20 LAB — PREGNANCY, URINE: Preg Test, Ur: NEGATIVE

## 2021-01-23 ENCOUNTER — Ambulatory Visit (HOSPITAL_COMMUNITY): Payer: Medicare Other | Admitting: Anesthesiology

## 2021-01-23 ENCOUNTER — Ambulatory Visit (HOSPITAL_COMMUNITY)
Admission: RE | Admit: 2021-01-23 | Discharge: 2021-01-23 | Disposition: A | Discharge status: Home / Self Care | Payer: Medicare Other | Attending: Internal Medicine | Admitting: Internal Medicine

## 2021-01-23 ENCOUNTER — Encounter (HOSPITAL_COMMUNITY)
Admission: RE | Disposition: A | Discharge status: Home / Self Care | Payer: Self-pay | Source: Home / Self Care | Attending: Internal Medicine

## 2021-01-23 ENCOUNTER — Encounter (HOSPITAL_COMMUNITY): Payer: Self-pay | Admitting: Internal Medicine

## 2021-01-23 ENCOUNTER — Other Ambulatory Visit: Payer: Self-pay

## 2021-01-23 DIAGNOSIS — Z1211 Encounter for screening for malignant neoplasm of colon: Secondary | ICD-10-CM | POA: Insufficient documentation

## 2021-01-23 DIAGNOSIS — Z91040 Latex allergy status: Secondary | ICD-10-CM | POA: Insufficient documentation

## 2021-01-23 DIAGNOSIS — Z79899 Other long term (current) drug therapy: Secondary | ICD-10-CM | POA: Insufficient documentation

## 2021-01-23 DIAGNOSIS — Z139 Encounter for screening, unspecified: Secondary | ICD-10-CM | POA: Diagnosis not present

## 2021-01-23 DIAGNOSIS — D122 Benign neoplasm of ascending colon: Secondary | ICD-10-CM | POA: Diagnosis not present

## 2021-01-23 DIAGNOSIS — K635 Polyp of colon: Secondary | ICD-10-CM | POA: Diagnosis not present

## 2021-01-23 DIAGNOSIS — Z9049 Acquired absence of other specified parts of digestive tract: Secondary | ICD-10-CM | POA: Insufficient documentation

## 2021-01-23 HISTORY — PX: COLONOSCOPY WITH PROPOFOL: SHX5780

## 2021-01-23 HISTORY — PX: POLYPECTOMY: SHX5525

## 2021-01-23 SURGERY — COLONOSCOPY WITH PROPOFOL
Anesthesia: General

## 2021-01-23 MED ORDER — LIDOCAINE HCL (CARDIAC) PF 100 MG/5ML IV SOSY
PREFILLED_SYRINGE | INTRAVENOUS | Status: DC | PRN
  Administered 2021-01-23: 50 mg via INTRAVENOUS

## 2021-01-23 MED ORDER — PROPOFOL 10 MG/ML IV BOLUS
INTRAVENOUS | Status: DC | PRN
  Administered 2021-01-23: 100 mg via INTRAVENOUS

## 2021-01-23 MED ORDER — STERILE WATER FOR IRRIGATION IR SOLN
Status: DC | PRN
  Administered 2021-01-23: 100 mL

## 2021-01-23 MED ORDER — MIDAZOLAM HCL 2 MG/2ML IJ SOLN
INTRAMUSCULAR | Status: AC
  Filled 2021-01-23: qty 2

## 2021-01-23 MED ORDER — PROPOFOL 500 MG/50ML IV EMUL
INTRAVENOUS | Status: DC | PRN
  Administered 2021-01-23: 150 ug/kg/min via INTRAVENOUS

## 2021-01-23 MED ORDER — MIDAZOLAM HCL 2 MG/2ML IJ SOLN
INTRAMUSCULAR | Status: DC | PRN
  Administered 2021-01-23: 2 mg via INTRAVENOUS

## 2021-01-23 MED ORDER — LACTATED RINGERS IV SOLN: INTRAVENOUS | Status: DC

## 2021-01-23 NOTE — Discharge Instructions (Addendum)
  Colonoscopy Discharge Instructions  Read the instructions outlined below and refer to this sheet in the next few weeks. These discharge instructions provide you with general information on caring for yourself after you leave the hospital. Your doctor may also give you specific instructions. While your treatment has been planned according to the most current medical practices available, unavoidable complications occasionally occur. If you have any problems or questions after discharge, call Dr. Gala Romney at [phone]. ACTIVITY You may resume your regular activity, but move at a slower pace for the next 24 hours.  Take frequent rest periods for the next 24 hours.  Walking will help get rid of the air and reduce the bloated feeling in your belly (abdomen).  No driving for 24 hours (because of the medicine (anesthesia) used during the test).   Do not sign any important legal documents or operate any machinery for 24 hours (because of the anesthesia used during the test).  NUTRITION Drink plenty of fluids.  You may resume your normal diet as instructed by your doctor.  Begin with a light meal and progress to your normal diet. Heavy or fried foods are harder to digest and may make you feel sick to your stomach (nauseated).  Avoid alcoholic beverages for 24 hours or as instructed.  MEDICATIONS You may resume your normal medications unless your doctor tells you otherwise.  WHAT YOU CAN EXPECT TODAY Some feelings of bloating in the abdomen.  Passage of more gas than usual.  Spotting of blood in your stool or on the toilet paper.  IF YOU HAD POLYPS REMOVED DURING THE COLONOSCOPY: No aspirin products for 7 days or as instructed.  No alcohol for 7 days or as instructed.  Eat a soft diet for the next 24 hours.  FINDING OUT THE RESULTS OF YOUR TEST Not all test results are available during your visit. If your test results are not back during the visit, make an appointment with your caregiver to find out the  results. Do not assume everything is normal if you have not heard from your caregiver or the medical facility. It is important for you to follow up on all of your test results.  SEEK IMMEDIATE MEDICAL ATTENTION IF: You have more than a spotting of blood in your stool.  Your belly is swollen (abdominal distention).  You are nauseated or vomiting.  You have a temperature over 101.  You have abdominal pain or discomfort that is severe or gets worse throughout the day.    1 polyp removed from your colon today in  Further recommendations to follow pending review of pathology report  At patient request I called Maudie Mercury Matdinis at [phone] -reviewed findings and recommendations

## 2021-01-23 NOTE — Transfer of Care (Signed)
Immediate Anesthesia Transfer of Care Note  Patient: Sara Vance  Procedure(s) Performed: COLONOSCOPY WITH PROPOFOL POLYPECTOMY  Patient Location: Endoscopy Unit  Anesthesia Type:General  Level of Consciousness: awake  Airway & Oxygen Therapy: Patient Spontanous Breathing  Post-op Assessment: Report given to RN and Post -op Vital signs reviewed and stable  Post vital signs: Reviewed and stable  Last Vitals:  Vitals Value Taken Time  BP    Temp    Pulse    Resp    SpO2      Last Pain:  Vitals:   01/23/21 0815  TempSrc:   PainSc: 0-No pain      Patients Stated Pain Goal: 6 (46/50/35 4656)  Complications: No notable events documented.
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
## 2021-01-23 NOTE — Op Note (Signed)
[facility] Patient Name: Sara Vance Procedure Date: 01/23/2021 7:59 AM MRN: [id number] Date of Birth: 07-14-1975 Attending MD: Norvel Richards , MD CSN: [id number] Age: 45 Admit Type: Outpatient Procedure:                Colonoscopy Indications:              Screening for colorectal malignant neoplasm Providers:                Norvel Richards, MD, Caprice Kluver, Aram Candela Referring MD:              Medicines:                Propofol per Anesthesia Complications:            No immediate complications. Estimated Blood Loss:     Estimated blood loss: none. Procedure:                Pre-Anesthesia Assessment:                           - Prior to the procedure, a History and Physical                            was performed, and patient medications and                            allergies were reviewed. The patient's tolerance of                            previous anesthesia was also reviewed. The risks                            and benefits of the procedure and the sedation                            options and risks were discussed with the patient.                            All questions were answered, and informed consent                            was obtained. Prior Anticoagulants: The patient has                            taken no previous anticoagulant or antiplatelet                            agents. ASA Grade Assessment: II - A patient with                            mild systemic disease. After reviewing the risks                            and benefits, the patient was deemed in  satisfactory condition to undergo the procedure.                           After obtaining informed consent, the colonoscope                            was passed under direct vision. Throughout the                            procedure, the patient's blood pressure, pulse, and                            oxygen saturations were monitored continuously. The                             [id number]) scope was introduced through the                            anus and advanced to the the cecum, identified by                            appendiceal orifice and ileocecal valve. The                            colonoscopy was performed without difficulty. The                            patient tolerated the procedure well. The quality                            of the bowel preparation was adequate. Scope In: 8:18:44 AM Scope Out: 8:30:31 AM Scope Withdrawal Time: 0 hours 7 minutes 41 seconds  Total Procedure Duration: 0 hours 11 minutes 47 seconds  Findings:      The perianal and digital rectal examinations were normal.      A 15 mm polyp was found in the ascending colon. The polyp was       semi-pedunculated. The polyp was removed with a hot snare. Resection and       retrieval were complete. Estimated blood loss: none.      The exam was otherwise without abnormality on direct and retroflexion       views. Impression:               - One 15 mm polyp in the ascending colon, removed                            with a hot snare. Resected and retrieved.                           - The examination was otherwise normal on direct                            and retroflexion views. Moderate Sedation:      Moderate (conscious) sedation was personally administered by an       anesthesia professional.  The following parameters were monitored: oxygen       saturation, heart rate, blood pressure, respiratory rate, EKG, adequacy       of pulmonary ventilation, and response to care. Recommendation:           - Patient has a contact number available for                            emergencies. The signs and symptoms of potential                            delayed complications were discussed with the                            patient. Return to normal activities tomorrow.                            Written discharge instructions were provided to the                             patient.                           - Resume previous diet.                           - Continue present medications.                           - Repeat colonoscopy date to be determined after                            pending pathology results are reviewed for                            surveillance.                           - Return to GI office after studies are complete. Procedure Code(s):        --- Professional ---                           [id number], Colonoscopy, flexible; with removal of                            tumor(s), polyp(s), or other lesion(s) by snare                            technique Diagnosis Code(s):        --- Professional ---                           Z12.11, Encounter for screening for malignant                            neoplasm of colon  K63.5, Polyp of colon CPT copyright 2019 American Medical Association. All rights reserved. The codes documented in this report are preliminary and upon coder review may  be revised to meet current compliance requirements. Cristopher Estimable. Nikki Rusnak, MD Norvel Richards, MD 01/23/2021 8:38:59 AM This report has been signed electronically. Number of Addenda: 0

## 2021-01-23 NOTE — Anesthesia Procedure Notes (Signed)
Date/Time: 01/23/2021 8:19 AM Performed by: Orlie Dakin, CRNA Pre-anesthesia Checklist: Patient identified, Emergency Drugs available, Suction available and Patient being monitored Patient Re-evaluated:Patient Re-evaluated prior to induction Oxygen Delivery Method: Nasal cannula Induction Type: IV induction Placement Confirmation: positive ETCO2

## 2021-01-23 NOTE — Anesthesia Postprocedure Evaluation (Signed)
Anesthesia Post Note  Patient: Sara Vance  Procedure(s) Performed: COLONOSCOPY WITH PROPOFOL POLYPECTOMY  Patient location during evaluation: Endoscopy Anesthesia Type: General Level of consciousness: awake and alert and oriented Pain management: pain level controlled Vital Signs Assessment: post-procedure vital signs reviewed and stable Respiratory status: spontaneous breathing and respiratory function stable Cardiovascular status: blood pressure returned to baseline and stable Postop Assessment: no apparent nausea or vomiting Anesthetic complications: no   No notable events documented.   Last Vitals:  Vitals:   01/23/21 0725 01/23/21 0833  BP: 121/68 (!) 94/46  Pulse: 76 60  Resp: 15 14  Temp: 37.1 C 36.5 C  SpO2: 96% 97%    Last Pain:  Vitals:   01/23/21 0833  TempSrc: Oral  PainSc: 0-No pain                 Higinio Grow C Tristina Sahagian

## 2021-01-24 ENCOUNTER — Encounter: Payer: Self-pay | Admitting: Internal Medicine

## 2021-01-24 LAB — SURGICAL PATHOLOGY

## 2021-01-26 ENCOUNTER — Encounter (HOSPITAL_COMMUNITY): Payer: Self-pay | Admitting: Internal Medicine

## 2021-03-08 ENCOUNTER — Ambulatory Visit: Payer: Medicare Other | Admitting: Family Medicine

## 2021-04-27 ENCOUNTER — Telehealth: Payer: Self-pay | Admitting: Obstetrics & Gynecology

## 2021-04-27 NOTE — Telephone Encounter (Signed)
Patient called stating that she would like Dr. Elonda Husky to place an order for her to have a Mammogram. Patient states that she is pass due. Please contact pt

## 2021-04-28 ENCOUNTER — Other Ambulatory Visit: Payer: Self-pay | Admitting: *Deleted

## 2021-05-02 ENCOUNTER — Other Ambulatory Visit: Payer: Self-pay | Admitting: *Deleted

## 2021-05-02 ENCOUNTER — Telehealth: Payer: Self-pay | Admitting: *Deleted

## 2021-05-02 DIAGNOSIS — N6489 Other specified disorders of breast: Secondary | ICD-10-CM

## 2021-05-02 DIAGNOSIS — Z09 Encounter for follow-up examination after completed treatment for conditions other than malignant neoplasm: Secondary | ICD-10-CM

## 2021-05-02 NOTE — Telephone Encounter (Signed)
LMOVM diagnostic mammogram has been scheduled for 1/31 at 10am at [facility].  Should arrive at 9:45 and to not apply lotion, powders or deo prior to procedure.

## 2021-05-09 IMAGING — MG MM BREAST BX W/ LOC DEV 1ST LESION IMAGE BX SPEC STEREO GUIDE*R*
6 of 10 series · 6 of 26 positions shown · non-contrast
Comparison: Previous exams.
COMPARISON: Previous exams.

Addendum:
CLINICAL DATA: Patient presents for stereotactic guided core biopsy
of the RIGHT breast.

EXAM:
RIGHT BREAST STEREOTACTIC CORE NEEDLE BIOPSY

[R (1 of 6)]
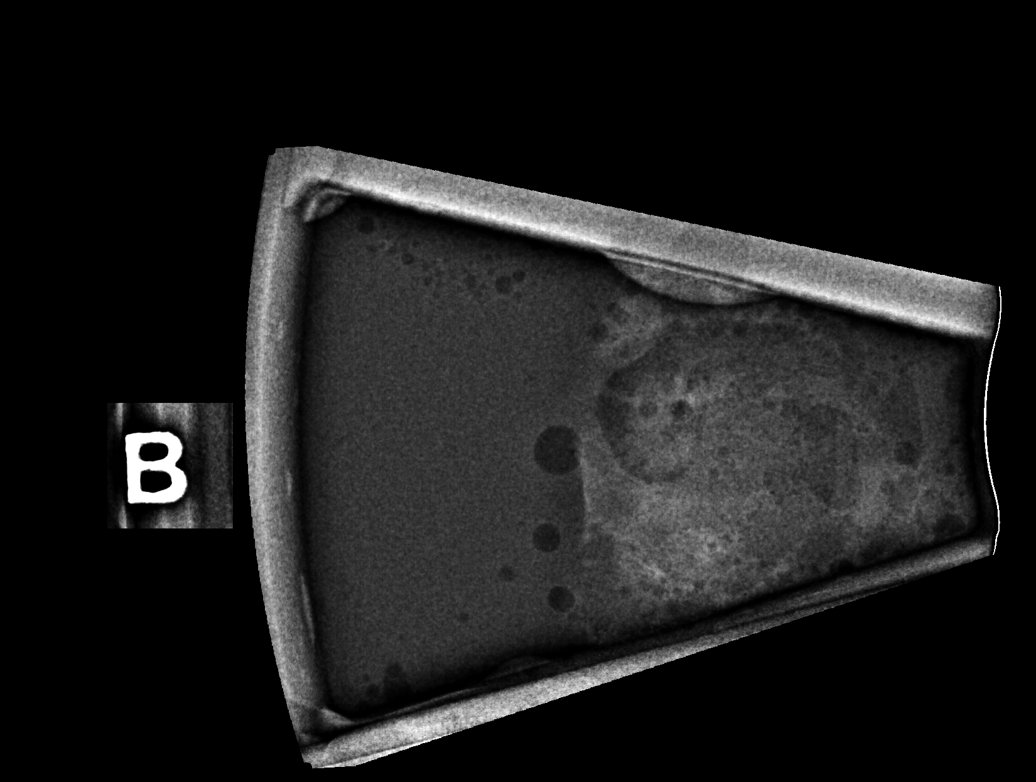

[R (2 of 6)]
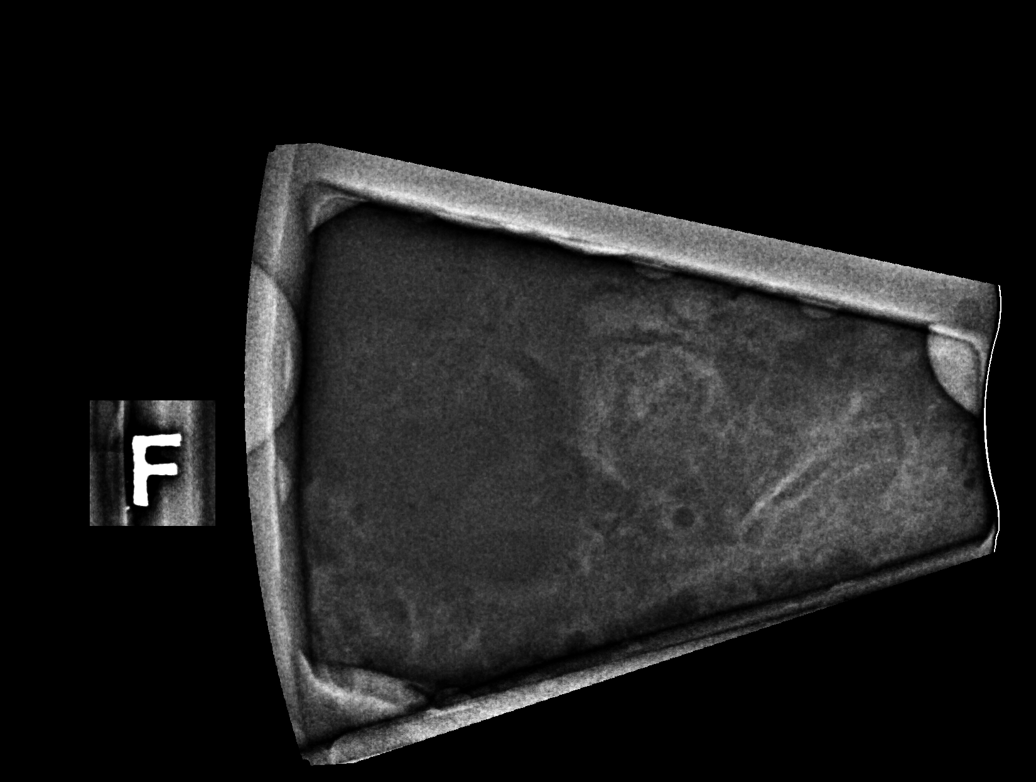

[R (3 of 6)]
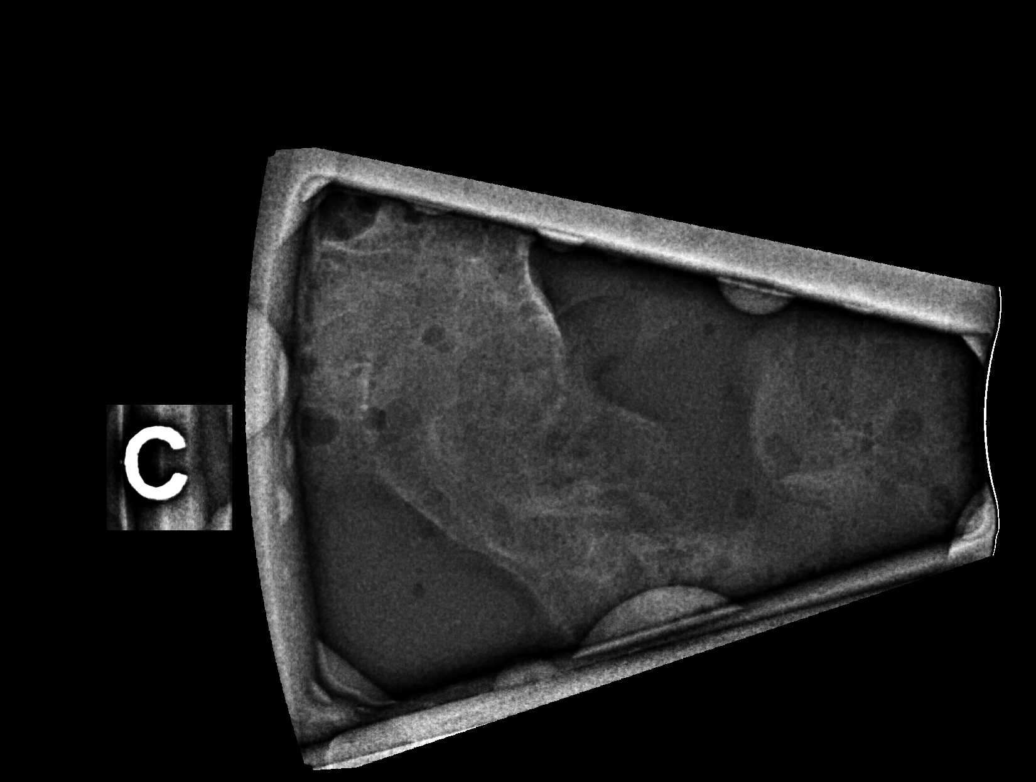

[R (4 of 6)]
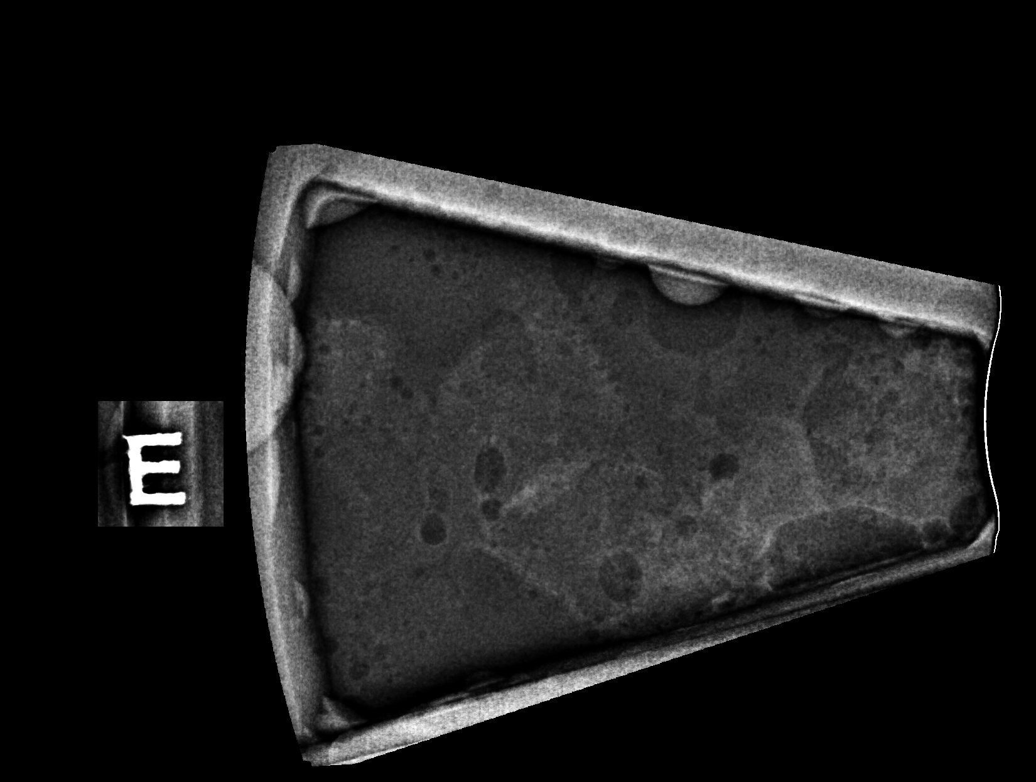

[R (5 of 6)]
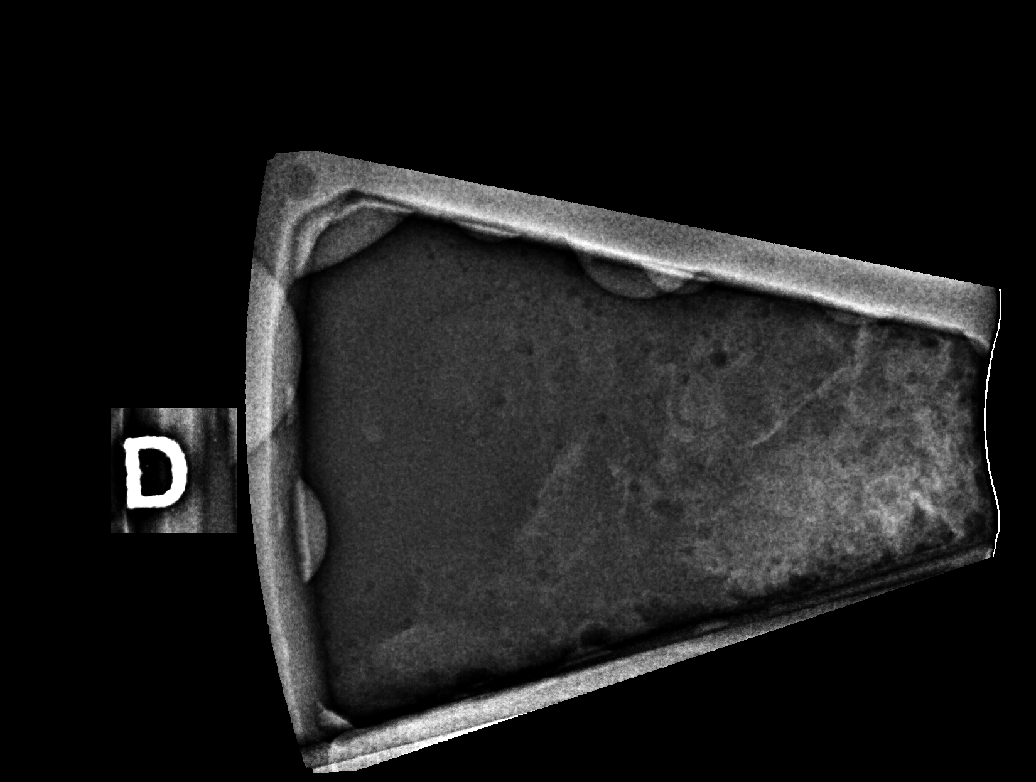

[R (6 of 6)]
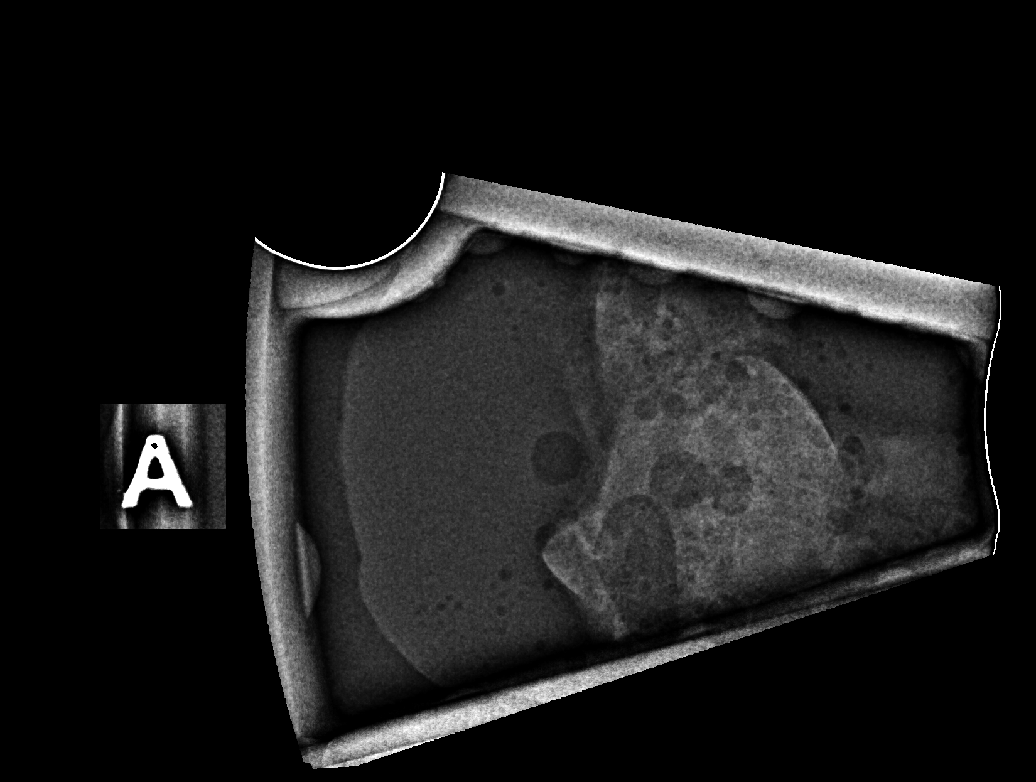

[6 of 26 positions shown; findings below may reference images not displayed]



Using sterile technique and 1% lidocaine and 1% lidocaine with
epinephrine as local anesthetic, under stereotactic guidance, a 9
gauge vacuum assisted device was used to perform core needle biopsy
of asymmetry in the UPPER-OUTER QUADRANT of the RIGHT breast using a
craniocaudal approach.

Lesion quadrant: UPPER-OUTER QUADRANT RIGHT breast

At the conclusion of the procedure, coil shaped tissue marker clip
was deployed into the biopsy cavity. Follow-up 2-view mammogram was
performed and dictated separately.
IMPRESSION: Stereotactic-guided biopsy of RIGHT breast asymmetry. No apparent
complications.

ADDENDUM:
Pathology revealed FIBROCYSTIC CHANGE WITH PSEUDOANGIOMATOUS STROMAL
HYPERPLASIA of the RIGHT breast, upper outer quadrant, (coil clip).
This was found to be concordant by Dr. Ciera Stenger.

Pathology results were discussed with the patient by telephone. The
patient reported doing well after the biopsy with tenderness at the
site. Post biopsy instructions and care were reviewed and questions
were answered. The patient was encouraged to call The [REDACTED] for any additional concerns. My direct phone
number was provided for the patient.

The patient was instructed to return for RIGHT diagnostic
mammography in 6 months at [HOSPITAL] in [HOSPITAL][HOSPITAL].

Pathology results reported by Isabelle Florence, RN on 10/06/2019.



Using sterile technique and 1% lidocaine and 1% lidocaine with
epinephrine as local anesthetic, under stereotactic guidance, a 9
gauge vacuum assisted device was used to perform core needle biopsy
of asymmetry in the UPPER-OUTER QUADRANT of the RIGHT breast using a
craniocaudal approach.

Lesion quadrant: UPPER-OUTER QUADRANT RIGHT breast

At the conclusion of the procedure, coil shaped tissue marker clip
was deployed into the biopsy cavity. Follow-up 2-view mammogram was
performed and dictated separately.
IMPRESSION: Stereotactic-guided biopsy of RIGHT breast asymmetry. No apparent
complications.

## 2021-05-30 ENCOUNTER — Ambulatory Visit (HOSPITAL_COMMUNITY): Admission: RE | Admit: 2021-05-30 | Payer: Medicare Other | Source: Ambulatory Visit

## 2021-05-30 ENCOUNTER — Other Ambulatory Visit: Payer: Self-pay

## 2021-05-30 ENCOUNTER — Ambulatory Visit (HOSPITAL_COMMUNITY)
Admission: RE | Admit: 2021-05-30 | Discharge: 2021-05-30 | Disposition: A | Discharge status: Home / Self Care | Payer: Medicare Other | Source: Ambulatory Visit | Attending: Obstetrics & Gynecology | Admitting: Obstetrics & Gynecology

## 2021-05-30 DIAGNOSIS — Z09 Encounter for follow-up examination after completed treatment for conditions other than malignant neoplasm: Secondary | ICD-10-CM | POA: Insufficient documentation

## 2021-05-30 DIAGNOSIS — N6489 Other specified disorders of breast: Secondary | ICD-10-CM | POA: Insufficient documentation

## 2021-05-30 DIAGNOSIS — R922 Inconclusive mammogram: Secondary | ICD-10-CM | POA: Diagnosis not present

## 2021-10-05 ENCOUNTER — Other Ambulatory Visit (INDEPENDENT_AMBULATORY_CARE_PROVIDER_SITE_OTHER): Payer: Medicare Other

## 2021-10-05 ENCOUNTER — Other Ambulatory Visit (HOSPITAL_COMMUNITY)
Admission: RE | Admit: 2021-10-05 | Discharge: 2021-10-05 | Disposition: A | Discharge status: Home / Self Care | Payer: Medicare Other | Source: Ambulatory Visit | Attending: Obstetrics & Gynecology | Admitting: Obstetrics & Gynecology

## 2021-10-05 DIAGNOSIS — N898 Other specified noninflammatory disorders of vagina: Secondary | ICD-10-CM | POA: Diagnosis not present

## 2021-10-05 NOTE — Progress Notes (Signed)
   NURSE VISIT- VAGINITIS/STD  SUBJECTIVE:  Sara Vance is a 46 y.o. G1P1 GYN patientfemale here for a vaginal swab for vaginitis screening, STD screen.  She reports the following symptoms: discharge described as clear, odor, and cramping  for 2 weeks. Denies abnormal vaginal bleeding, significant pelvic pain, fever, or UTI symptoms.  OBJECTIVE:  There were no vitals taken for this visit.  Appears well, in no apparent distress  ASSESSMENT: Vaginal swab for vaginitis screening/STD screen  PLAN: Self-collected vaginal probe for Gonorrhea, Chlamydia, Trichomonas, Bacterial Vaginosis, Yeast sent to lab Treatment: to be determined once results are received Follow-up as needed if symptoms persist/worsen, or new symptoms develop  Sara Vance  10/05/2021 3:51 PM

## 2021-10-09 LAB — CERVICOVAGINAL ANCILLARY ONLY
Bacterial Vaginitis (gardnerella): NEGATIVE
Candida Glabrata: NEGATIVE
Candida Vaginitis: POSITIVE — AB
Chlamydia: NEGATIVE
Comment: NEGATIVE
Comment: NEGATIVE
Comment: NEGATIVE
Comment: NEGATIVE
Comment: NEGATIVE
Comment: NORMAL
Neisseria Gonorrhea: NEGATIVE
Trichomonas: NEGATIVE

## 2021-10-10 ENCOUNTER — Other Ambulatory Visit: Payer: Self-pay | Admitting: Adult Health

## 2021-10-10 DIAGNOSIS — F251 Schizoaffective disorder, depressive type: Secondary | ICD-10-CM | POA: Diagnosis not present

## 2021-10-10 MED ORDER — FLUCONAZOLE 150 MG PO TABS: ORAL_TABLET | ORAL | 1 refills | Status: DC

## 2021-10-17 ENCOUNTER — Ambulatory Visit: Payer: Medicare Other | Admitting: Adult Health

## 2021-10-17 ENCOUNTER — Telehealth: Payer: Self-pay | Admitting: Orthopaedic Surgery

## 2021-10-17 NOTE — Telephone Encounter (Signed)
Noted  

## 2021-10-17 NOTE — Telephone Encounter (Signed)
I called patient, she is feeling much better, is able to WTB on ankle.  Will call us if needed in future.

## 2021-10-17 NOTE — Telephone Encounter (Addendum)
Patient had left vice message late morning today regarding a twisting injury to her left ankle - said 'same ankle as last time;'  states it happened last night when she jumped up from couch and then fell. States would rather come to office Vs urgent care or ER. Based on current schedules, please advise.

## 2021-11-21 DIAGNOSIS — F251 Schizoaffective disorder, depressive type: Secondary | ICD-10-CM | POA: Diagnosis not present

## 2021-11-30 DIAGNOSIS — H52223 Regular astigmatism, bilateral: Secondary | ICD-10-CM | POA: Diagnosis not present

## 2021-12-26 ENCOUNTER — Ambulatory Visit (INDEPENDENT_AMBULATORY_CARE_PROVIDER_SITE_OTHER): Payer: Medicare Other | Admitting: Internal Medicine

## 2021-12-26 ENCOUNTER — Encounter: Payer: Self-pay | Admitting: Internal Medicine

## 2021-12-26 VITALS — BP 110/80 | HR 73 | Temp 98.7°F | Ht 63.8 in | Wt 155.8 lb

## 2021-12-26 DIAGNOSIS — F250 Schizoaffective disorder, bipolar type: Secondary | ICD-10-CM

## 2021-12-26 DIAGNOSIS — Z23 Encounter for immunization: Secondary | ICD-10-CM

## 2021-12-26 DIAGNOSIS — F251 Schizoaffective disorder, depressive type: Secondary | ICD-10-CM | POA: Diagnosis not present

## 2021-12-26 DIAGNOSIS — Z0000 Encounter for general adult medical examination without abnormal findings: Secondary | ICD-10-CM | POA: Diagnosis not present

## 2021-12-26 LAB — CBC WITH DIFFERENTIAL/PLATELET
Basophils Absolute: 0 10*3/uL (ref 0.0–0.1)
Basophils Relative: 0.6 % (ref 0.0–3.0)
Eosinophils Absolute: 0.1 10*3/uL (ref 0.0–0.7)
Eosinophils Relative: 0.8 % (ref 0.0–5.0)
HCT: 38.3 % (ref 36.0–46.0)
Hemoglobin: 13.1 g/dL (ref 12.0–15.0)
Lymphocytes Relative: 25.1 % (ref 12.0–46.0)
Lymphs Abs: 1.9 10*3/uL (ref 0.7–4.0)
MCHC: 34.1 g/dL (ref 30.0–36.0)
MCV: 92.8 fl (ref 78.0–100.0)
Monocytes Absolute: 0.5 10*3/uL (ref 0.1–1.0)
Monocytes Relative: 7 % (ref 3.0–12.0)
Neutro Abs: 5.1 10*3/uL (ref 1.4–7.7)
Neutrophils Relative %: 66.5 % (ref 43.0–77.0)
Platelets: 301 10*3/uL (ref 150.0–400.0)
RBC: 4.12 Mil/uL (ref 3.87–5.11)
RDW: 12 % (ref 11.5–15.5)
WBC: 7.7 10*3/uL (ref 4.0–10.5)

## 2021-12-26 LAB — COMPREHENSIVE METABOLIC PANEL
ALT: 13 U/L (ref 0–35)
AST: 16 U/L (ref 0–37)
Albumin: 4.1 g/dL (ref 3.5–5.2)
Alkaline Phosphatase: 51 U/L (ref 39–117)
BUN: 10 mg/dL (ref 6–23)
CO2: 28 mEq/L (ref 19–32)
Calcium: 9 mg/dL (ref 8.4–10.5)
Chloride: 102 mEq/L (ref 96–112)
Creatinine, Ser: 0.9 mg/dL (ref 0.40–1.20)
GFR: 75.88 mL/min (ref >60.00)
Glucose, Bld: 74 mg/dL (ref 70–99)
Potassium: 4 mEq/L (ref 3.5–5.1)
Sodium: 138 mEq/L (ref 135–145)
Total Bilirubin: 0.7 mg/dL (ref 0.2–1.2)
Total Protein: 6.7 g/dL (ref 6.0–8.3)

## 2021-12-26 LAB — LIPID PANEL
Cholesterol: 159 mg/dL (ref 0–200)
HDL: 58.7 mg/dL (ref >39.00)
LDL Cholesterol: 89 mg/dL (ref 0–99)
NonHDL: 100.37
Total CHOL/HDL Ratio: 3
Triglycerides: 55 mg/dL (ref 0.0–149.0)
VLDL: 11 mg/dL (ref 0.0–40.0)

## 2021-12-26 LAB — VITAMIN B12: Vitamin B-12: 345 pg/mL (ref 211–911)

## 2021-12-26 LAB — HEMOGLOBIN A1C: Hgb A1c MFr Bld: 4.9 % (ref 4.6–6.5)

## 2021-12-26 LAB — VITAMIN D 25 HYDROXY (VIT D DEFICIENCY, FRACTURES): VITD: 59.02 ng/mL (ref 30.00–100.00)

## 2021-12-26 LAB — TSH: TSH: 1.15 u[IU]/mL (ref 0.35–5.50)
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
## 2022-01-29 DIAGNOSIS — F251 Schizoaffective disorder, depressive type: Secondary | ICD-10-CM | POA: Diagnosis not present

## 2022-01-31 DIAGNOSIS — F900 Attention-deficit hyperactivity disorder, predominantly inattentive type: Secondary | ICD-10-CM | POA: Diagnosis not present

## 2022-01-31 DIAGNOSIS — F251 Schizoaffective disorder, depressive type: Secondary | ICD-10-CM | POA: Diagnosis not present

## 2022-04-05 DIAGNOSIS — F251 Schizoaffective disorder, depressive type: Secondary | ICD-10-CM | POA: Diagnosis not present

## 2022-05-02 DIAGNOSIS — Z79899 Other long term (current) drug therapy: Secondary | ICD-10-CM | POA: Diagnosis not present

## 2022-05-02 DIAGNOSIS — F251 Schizoaffective disorder, depressive type: Secondary | ICD-10-CM | POA: Diagnosis not present

## 2022-05-10 DIAGNOSIS — F251 Schizoaffective disorder, depressive type: Secondary | ICD-10-CM | POA: Diagnosis not present

## 2022-06-19 DIAGNOSIS — F3112 Bipolar disorder, current episode manic without psychotic features, moderate: Secondary | ICD-10-CM | POA: Diagnosis not present

## 2022-06-21 ENCOUNTER — Encounter: Payer: Self-pay | Admitting: Adult Health

## 2022-06-21 ENCOUNTER — Ambulatory Visit: Payer: Medicare Other | Admitting: Adult Health

## 2022-06-21 VITALS — BP 108/68 | HR 79 | Ht 64.0 in | Wt 160.5 lb

## 2022-06-21 DIAGNOSIS — N926 Irregular menstruation, unspecified: Secondary | ICD-10-CM

## 2022-06-21 DIAGNOSIS — Z3009 Encounter for other general counseling and advice on contraception: Secondary | ICD-10-CM

## 2022-06-21 DIAGNOSIS — R61 Generalized hyperhidrosis: Secondary | ICD-10-CM | POA: Diagnosis not present

## 2022-06-21 DIAGNOSIS — D229 Melanocytic nevi, unspecified: Secondary | ICD-10-CM | POA: Diagnosis not present

## 2022-06-21 LAB — POCT URINE PREGNANCY: Preg Test, Ur: NEGATIVE

## 2022-06-21 NOTE — Progress Notes (Signed)
  Subjective:     Patient ID: Sara Vance, female   DOB: 16-Jul-1975, 47 y.o.   MRN: [id number]  HPI Joseph Art is a 47 year old white female, divorced, G1P1 in wanting spot checked left breast and discuss birth control, had condoms come off. She wants non hormonal. She says periods not regular, she is sp ablation,years ago and is hot at night and does not sleep well.   Last pap was negative HPV and NILM 09/01/19.  PCP is Dr Deniece Ree  Review of Systems Spot left breast Periods not regular Hot at night Does not sleep well Reviewed past medical,surgical, social and family history. Reviewed medications and allergies.     Objective:   Physical Exam BP 108/68 (BP Location: Left Arm, Patient Position: Sitting, Cuff Size: Normal)   Pulse 79   Ht 5' 4"$  (1.626 m)   Wt 160 lb 8 oz (72.8 kg)   LMP  (LMP Unknown)   BMI 27.55 kg/m  UPT is negative. Skin warm and dry.  Lungs: clear to ausculation bilaterally. Cardiovascular: regular rate and rhythm.   Breasts:no dominate palpable mass, retraction or nipple discharge, has mole at about 7 0' clock left breast. Fall risk is moderate  Upstream - 06/21/22 1352       Pregnancy Intention Screening   Does the patient want to become pregnant in the next year? No    Does the patient's partner want to become pregnant in the next year? No    Would the patient like to discuss contraceptive options today? Yes      Contraception Wrap Up   Current Method Female Condom    End Method Female Condom    Contraception Counseling Provided Yes                Assessment:     1. Fleshy skin mole Time for mammogram, le them know about mole location   2. Irregular periods Check FSH  3. Night sweats Check FSH  4. General counseling and advice on contraceptive management Discussed condoms, phexxi and ParaGard, will check [place] if PM no BC needed, Will use condoms for now    Plan:     Follow up in 3 months for pap

## 2022-06-22 LAB — FOLLICLE STIMULATING HORMONE: FSH: 9.6 m[IU]/mL

## 2022-06-28 ENCOUNTER — Encounter: Payer: Self-pay | Admitting: Radiology

## 2022-07-03 DIAGNOSIS — F900 Attention-deficit hyperactivity disorder, predominantly inattentive type: Secondary | ICD-10-CM | POA: Diagnosis not present

## 2022-07-03 DIAGNOSIS — F251 Schizoaffective disorder, depressive type: Secondary | ICD-10-CM | POA: Diagnosis not present

## 2022-07-12 DIAGNOSIS — F251 Schizoaffective disorder, depressive type: Secondary | ICD-10-CM | POA: Diagnosis not present

## 2022-07-12 DIAGNOSIS — F900 Attention-deficit hyperactivity disorder, predominantly inattentive type: Secondary | ICD-10-CM | POA: Diagnosis not present

## 2022-07-16 ENCOUNTER — Other Ambulatory Visit (HOSPITAL_COMMUNITY): Payer: Self-pay | Admitting: Adult Health

## 2022-07-16 DIAGNOSIS — Z1231 Encounter for screening mammogram for malignant neoplasm of breast: Secondary | ICD-10-CM

## 2022-07-23 DIAGNOSIS — K080 Exfoliation of teeth due to systemic causes: Secondary | ICD-10-CM | POA: Diagnosis not present

## 2022-07-24 DIAGNOSIS — F902 Attention-deficit hyperactivity disorder, combined type: Secondary | ICD-10-CM | POA: Diagnosis not present

## 2022-07-24 DIAGNOSIS — F251 Schizoaffective disorder, depressive type: Secondary | ICD-10-CM | POA: Diagnosis not present

## 2022-07-25 ENCOUNTER — Ambulatory Visit (HOSPITAL_COMMUNITY)
Admission: RE | Admit: 2022-07-25 | Discharge: 2022-07-25 | Disposition: A | Discharge status: Home / Self Care | Payer: Medicare Other | Source: Ambulatory Visit | Attending: Adult Health | Admitting: Adult Health

## 2022-07-25 DIAGNOSIS — Z1231 Encounter for screening mammogram for malignant neoplasm of breast: Secondary | ICD-10-CM | POA: Diagnosis not present

## 2022-07-30 DIAGNOSIS — K080 Exfoliation of teeth due to systemic causes: Secondary | ICD-10-CM | POA: Diagnosis not present

## 2022-08-24 DIAGNOSIS — F251 Schizoaffective disorder, depressive type: Secondary | ICD-10-CM | POA: Diagnosis not present

## 2022-08-24 DIAGNOSIS — F900 Attention-deficit hyperactivity disorder, predominantly inattentive type: Secondary | ICD-10-CM | POA: Diagnosis not present

## 2022-09-05 DIAGNOSIS — F251 Schizoaffective disorder, depressive type: Secondary | ICD-10-CM | POA: Diagnosis not present

## 2022-09-06 DIAGNOSIS — K080 Exfoliation of teeth due to systemic causes: Secondary | ICD-10-CM | POA: Diagnosis not present

## 2022-09-19 ENCOUNTER — Encounter: Payer: Self-pay | Admitting: Adult Health

## 2022-09-19 ENCOUNTER — Other Ambulatory Visit (HOSPITAL_COMMUNITY)
Admission: RE | Admit: 2022-09-19 | Discharge: 2022-09-19 | Disposition: A | Discharge status: Home / Self Care | Payer: Medicare Other | Source: Ambulatory Visit | Attending: Adult Health | Admitting: Adult Health

## 2022-09-19 ENCOUNTER — Ambulatory Visit (INDEPENDENT_AMBULATORY_CARE_PROVIDER_SITE_OTHER): Payer: Medicare Other | Admitting: Adult Health

## 2022-09-19 VITALS — BP 110/68 | HR 64 | Ht 63.75 in | Wt 158.0 lb

## 2022-09-19 DIAGNOSIS — Z1151 Encounter for screening for human papillomavirus (HPV): Secondary | ICD-10-CM | POA: Diagnosis not present

## 2022-09-19 DIAGNOSIS — Z3009 Encounter for other general counseling and advice on contraception: Secondary | ICD-10-CM

## 2022-09-19 DIAGNOSIS — Z1211 Encounter for screening for malignant neoplasm of colon: Secondary | ICD-10-CM

## 2022-09-19 DIAGNOSIS — Z30011 Encounter for initial prescription of contraceptive pills: Secondary | ICD-10-CM | POA: Diagnosis not present

## 2022-09-19 DIAGNOSIS — Z3202 Encounter for pregnancy test, result negative: Secondary | ICD-10-CM | POA: Diagnosis not present

## 2022-09-19 DIAGNOSIS — Z01419 Encounter for gynecological examination (general) (routine) without abnormal findings: Secondary | ICD-10-CM | POA: Insufficient documentation

## 2022-09-19 LAB — POCT URINE PREGNANCY: Preg Test, Ur: NEGATIVE

## 2022-09-19 LAB — HEMOCCULT GUIAC POC 1CARD (OFFICE): Fecal Occult Blood, POC: NEGATIVE

## 2022-09-19 MED ORDER — NORETHINDRONE 0.35 MG PO TABS: 1.0000 | ORAL_TABLET | Freq: Every day | ORAL | 11 refills | Status: DC

## 2022-09-19 NOTE — Progress Notes (Signed)
Patient ID: Sara Vance, female   DOB: February 19, 1976, 47 y.o.   MRN: [id number] History of Present Illness: Sara Vance is a 47 year old white female, divorced, G1P1 in for a well woman gyn exam and pap. She is sp ablation, but is having periods, and wants birth control, has a friend now.  PCP is Dr Loreta Ave.   Current Medications, Allergies, Past Medical History, Past Surgical History, Family History and Social History were reviewed in [employer] record.     Review of Systems: Patient denies any headaches, hearing loss, fatigue, blurred vision, shortness of breath, chest pain, abdominal pain, problems with bowel movements, urination, or intercourse. No joint pain or mood swings.     Physical Exam:BP 110/68 (BP Location: Left Arm, Patient Position: Sitting, Cuff Size: Normal)   Pulse 64   Ht 5' 3.75" (1.619 m)   Wt 158 lb (71.7 kg)   BMI 27.33 kg/m   UPT is negative  General:  Well developed, well nourished, no acute distress Skin:  Warm and dry Neck:  Midline trachea, normal thyroid, good ROM, no lymphadenopathy Lungs; Clear to auscultation bilaterally Breast:  No dominant palpable mass, retraction, or nipple discharge Cardiovascular: Regular rate and rhythm Abdomen:  Soft, non tender, no hepatosplenomegaly Pelvic:  External genitalia is normal in appearance, no lesions.  The vagina is normal in appearance. Urethra has no lesions or masses. The cervix is bulbous,pap with GC/CHL and HR HPV genotyping performed.  Uterus is felt to be normal size, shape, and contour.  No adnexal masses or tenderness noted.Bladder is non tender, no masses felt. Rectal: Good sphincter tone, no polyps, or hemorrhoids felt.  Hemoccult negative. Extremities/musculoskeletal:  No swelling or varicosities noted, no clubbing or cyanosis Psych:  No mood changes, alert and cooperative,seems happy AA is 0 Fall risk is high    09/19/2022   10:45 AM 12/26/2021   10:16 AM 11/29/2020   12:57 PM   Depression screen PHQ 2/9  Decreased Interest 0 1 0  Down, Depressed, Hopeless 1 2 0  PHQ - 2 Score 1 3 0  Altered sleeping 1 3 1   Tired, decreased energy 1 2 1   Change in appetite 2 2 0  Feeling bad or failure about yourself  2 3 0  Trouble concentrating 0  0  Moving slowly or fidgety/restless 0 1 0  Suicidal thoughts 0 0 0  PHQ-9 Score 7 14 2   Difficult doing work/chores  Somewhat difficult Not difficult at all   She is on meds     09/19/2022   10:45 AM 09/01/2019   11:54 AM  GAD 7 : Generalized Anxiety Score  Nervous, Anxious, on Edge 1 0  Control/stop worrying 1 0  Worry too much - different things 1 0  Trouble relaxing 1 0  Restless 0 0  Easily annoyed or irritable 2 1  Afraid - awful might happen 2 1  Total GAD 7 Score 8 2  Anxiety Difficulty  Not difficult at all      Upstream - 09/19/22 1103       Pregnancy Intention Screening   Does the patient want to become pregnant in the next year? No    Does the patient's partner want to become pregnant in the next year? No    Would the patient like to discuss contraceptive options today? Yes      Contraception Wrap Up   Current Method No Method - Other Reason    Reason for No Current Contraceptive  Method at Intake ([facility] Only) Other    End Method Oral Contraceptive    Contraception Counseling Provided Yes    How was the end contraceptive method provided? Prescription            Examination chaperoned by Malachy Mood LPN   Impression and Plan: 1. Encounter for gynecological examination with Papanicolaou smear of cervix Pap sent Pap in 3 years if normal Physical in 1 year Labs with PCP Had negative mammogram 07/25/22.  - Cytology - PAP( [place]) Colonoscopy per GI  2. Pregnancy examination or test, negative result - POCT urine pregnancy  3. Encounter for screening fecal occult blood testing Hemoccult was negative - POCT occult blood stool  4. General counseling and advice on contraceptive  management Discussed options, pills, depo and ring, IUD and nexplanon and phexxi, she will try POP He will not use condom She has handout on phexxi  5. Encounter for initial prescription of contraceptive pills Will rx Micronor, can start today,use back up for 1 pack  Meds ordered this encounter  Medications   norethindrone (MICRONOR) 0.35 MG tablet    Sig: Take 1 tablet (0.35 mg total) by mouth daily.    Dispense:  28 tablet    Refill:  11    Order Specific Question:   Supervising Provider    Answer:   Duane Lope H [[id number]]

## 2022-09-27 LAB — CYTOLOGY - PAP
Chlamydia: NEGATIVE
Comment: NEGATIVE
Comment: NEGATIVE
Comment: NORMAL
Diagnosis: NEGATIVE
Diagnosis: REACTIVE
High risk HPV: NEGATIVE
Neisseria Gonorrhea: NEGATIVE

## 2022-10-03 DIAGNOSIS — F251 Schizoaffective disorder, depressive type: Secondary | ICD-10-CM | POA: Diagnosis not present

## 2022-10-08 DIAGNOSIS — F251 Schizoaffective disorder, depressive type: Secondary | ICD-10-CM | POA: Diagnosis not present

## 2022-10-31 DIAGNOSIS — F251 Schizoaffective disorder, depressive type: Secondary | ICD-10-CM | POA: Diagnosis not present

## 2022-10-31 DIAGNOSIS — F900 Attention-deficit hyperactivity disorder, predominantly inattentive type: Secondary | ICD-10-CM | POA: Diagnosis not present

## 2022-11-14 DIAGNOSIS — F900 Attention-deficit hyperactivity disorder, predominantly inattentive type: Secondary | ICD-10-CM | POA: Diagnosis not present

## 2022-11-14 DIAGNOSIS — F251 Schizoaffective disorder, depressive type: Secondary | ICD-10-CM | POA: Diagnosis not present

## 2022-12-05 DIAGNOSIS — F251 Schizoaffective disorder, depressive type: Secondary | ICD-10-CM | POA: Diagnosis not present

## 2022-12-05 DIAGNOSIS — F900 Attention-deficit hyperactivity disorder, predominantly inattentive type: Secondary | ICD-10-CM | POA: Diagnosis not present

## 2022-12-13 ENCOUNTER — Encounter (INDEPENDENT_AMBULATORY_CARE_PROVIDER_SITE_OTHER): Payer: Self-pay

## 2022-12-19 ENCOUNTER — Encounter: Payer: Self-pay | Admitting: Adult Health

## 2022-12-19 ENCOUNTER — Telehealth (INDEPENDENT_AMBULATORY_CARE_PROVIDER_SITE_OTHER): Payer: Medicare Other | Admitting: Adult Health

## 2022-12-19 VITALS — Ht 63.75 in | Wt 155.0 lb

## 2022-12-19 DIAGNOSIS — U071 COVID-19: Secondary | ICD-10-CM | POA: Diagnosis not present

## 2022-12-19 DIAGNOSIS — R051 Acute cough: Secondary | ICD-10-CM

## 2022-12-19 DIAGNOSIS — F251 Schizoaffective disorder, depressive type: Secondary | ICD-10-CM | POA: Diagnosis not present

## 2022-12-19 DIAGNOSIS — F900 Attention-deficit hyperactivity disorder, predominantly inattentive type: Secondary | ICD-10-CM | POA: Diagnosis not present

## 2022-12-19 MED ORDER — BENZONATATE 200 MG PO CAPS: 200.0000 mg | ORAL_CAPSULE | Freq: Three times a day (TID) | ORAL | 0 refills | Status: DC | PRN
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
## 2023-01-02 ENCOUNTER — Ambulatory Visit (INDEPENDENT_AMBULATORY_CARE_PROVIDER_SITE_OTHER): Payer: Medicare Other | Admitting: Internal Medicine

## 2023-01-02 VITALS — BP 90/64 | HR 63 | Temp 98.0°F | Ht 63.0 in | Wt 157.7 lb

## 2023-01-02 DIAGNOSIS — F250 Schizoaffective disorder, bipolar type: Secondary | ICD-10-CM | POA: Diagnosis not present

## 2023-01-02 DIAGNOSIS — Z0000 Encounter for general adult medical examination without abnormal findings: Secondary | ICD-10-CM

## 2023-01-02 DIAGNOSIS — Z23 Encounter for immunization: Secondary | ICD-10-CM | POA: Diagnosis not present

## 2023-01-02 DIAGNOSIS — Z114 Encounter for screening for human immunodeficiency virus [HIV]: Secondary | ICD-10-CM | POA: Diagnosis not present

## 2023-01-02 LAB — CBC WITH DIFFERENTIAL/PLATELET
Basophils Absolute: 0.1 10*3/uL (ref 0.0–0.1)
Basophils Relative: 0.7 % (ref 0.0–3.0)
Eosinophils Absolute: 0.1 10*3/uL (ref 0.0–0.7)
Eosinophils Relative: 1.8 % (ref 0.0–5.0)
HCT: 39 % (ref 36.0–46.0)
Hemoglobin: 12.8 g/dL (ref 12.0–15.0)
Lymphocytes Relative: 24.5 % (ref 12.0–46.0)
Lymphs Abs: 2.1 10*3/uL (ref 0.7–4.0)
MCHC: 32.8 g/dL (ref 30.0–36.0)
MCV: 94.5 fl (ref 78.0–100.0)
Monocytes Absolute: 0.7 10*3/uL (ref 0.1–1.0)
Monocytes Relative: 8.3 % (ref 3.0–12.0)
Neutro Abs: 5.4 10*3/uL (ref 1.4–7.7)
Neutrophils Relative %: 64.7 % (ref 43.0–77.0)
Platelets: 358 10*3/uL (ref 150.0–400.0)
RBC: 4.13 Mil/uL (ref 3.87–5.11)
RDW: 13.1 % (ref 11.5–15.5)
WBC: 8.4 10*3/uL (ref 4.0–10.5)

## 2023-01-02 LAB — COMPREHENSIVE METABOLIC PANEL
ALT: 15 U/L (ref 0–35)
AST: 15 U/L (ref 0–37)
Albumin: 3.9 g/dL (ref 3.5–5.2)
Alkaline Phosphatase: 54 U/L (ref 39–117)
BUN: 12 mg/dL (ref 6–23)
CO2: 30 meq/L (ref 19–32)
Calcium: 9 mg/dL (ref 8.4–10.5)
Chloride: 104 meq/L (ref 96–112)
Creatinine, Ser: 0.85 mg/dL (ref 0.40–1.20)
GFR: 80.69 mL/min (ref >60.00)
Glucose, Bld: 84 mg/dL (ref 70–99)
Potassium: 3.9 meq/L (ref 3.5–5.1)
Sodium: 139 meq/L (ref 135–145)
Total Bilirubin: 0.8 mg/dL (ref 0.2–1.2)
Total Protein: 6.8 g/dL (ref 6.0–8.3)

## 2023-01-02 LAB — LIPID PANEL
Cholesterol: 133 mg/dL (ref 0–200)
HDL: 52.3 mg/dL (ref >39.00)
LDL Cholesterol: 69 mg/dL (ref 0–99)
NonHDL: 80.23
Total CHOL/HDL Ratio: 3
Triglycerides: 57 mg/dL (ref 0.0–149.0)
VLDL: 11.4 mg/dL (ref 0.0–40.0)

## 2023-01-02 LAB — VITAMIN D 25 HYDROXY (VIT D DEFICIENCY, FRACTURES): VITD: 44.91 ng/mL (ref 30.00–100.00)

## 2023-01-02 LAB — TSH: TSH: 2.72 u[IU]/mL (ref 0.35–5.50)

## 2023-01-02 LAB — VITAMIN B12: Vitamin B-12: 468 pg/mL (ref 211–911)
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
## 2023-01-02 NOTE — Addendum Note (Signed)
Addended by: Elwin Mocha on: 01/02/2023 09:42 AM   Modules accepted: Orders

## 2023-01-03 LAB — HIV ANTIBODY (ROUTINE TESTING W REFLEX): HIV 1&2 Ab, 4th Generation: NONREACTIVE

## 2023-01-16 DIAGNOSIS — F900 Attention-deficit hyperactivity disorder, predominantly inattentive type: Secondary | ICD-10-CM | POA: Diagnosis not present

## 2023-01-16 DIAGNOSIS — F251 Schizoaffective disorder, depressive type: Secondary | ICD-10-CM | POA: Diagnosis not present

## 2023-01-24 DIAGNOSIS — F251 Schizoaffective disorder, depressive type: Secondary | ICD-10-CM | POA: Diagnosis not present

## 2023-01-24 DIAGNOSIS — F900 Attention-deficit hyperactivity disorder, predominantly inattentive type: Secondary | ICD-10-CM | POA: Diagnosis not present

## 2023-01-25 ENCOUNTER — Ambulatory Visit: Payer: Medicare Other | Admitting: Adult Health

## 2023-01-27 DIAGNOSIS — E663 Overweight: Secondary | ICD-10-CM | POA: Diagnosis not present

## 2023-01-27 DIAGNOSIS — N390 Urinary tract infection, site not specified: Secondary | ICD-10-CM | POA: Diagnosis not present

## 2023-01-27 DIAGNOSIS — Z6827 Body mass index (BMI) 27.0-27.9, adult: Secondary | ICD-10-CM | POA: Diagnosis not present

## 2023-01-30 ENCOUNTER — Ambulatory Visit: Payer: Medicare Other | Admitting: Family Medicine

## 2023-02-07 ENCOUNTER — Ambulatory Visit: Payer: Medicare Other | Admitting: Nurse Practitioner

## 2023-02-07 VITALS — BP 94/60 | HR 63 | Temp 97.6°F | Ht 63.0 in | Wt 159.0 lb

## 2023-02-07 DIAGNOSIS — R300 Dysuria: Secondary | ICD-10-CM

## 2023-02-07 DIAGNOSIS — F251 Schizoaffective disorder, depressive type: Secondary | ICD-10-CM | POA: Diagnosis not present

## 2023-02-07 DIAGNOSIS — F900 Attention-deficit hyperactivity disorder, predominantly inattentive type: Secondary | ICD-10-CM | POA: Diagnosis not present

## 2023-02-07 LAB — POC URINALSYSI DIPSTICK (AUTOMATED)
Bilirubin, UA: NEGATIVE
Blood, UA: POSITIVE
Glucose, UA: NEGATIVE
Ketones, UA: NEGATIVE
Nitrite, UA: NEGATIVE
Protein, UA: NEGATIVE
Spec Grav, UA: 1.015 (ref 1.010–1.025)
Urobilinogen, UA: 0.2 U/dL
pH, UA: 6 (ref 5.0–8.0)

## 2023-02-07 LAB — POCT URINE PREGNANCY: Preg Test, Ur: NEGATIVE

## 2023-02-07 MED ORDER — SULFAMETHOXAZOLE-TRIMETHOPRIM 800-160 MG PO TABS
1.0000 | ORAL_TABLET | Freq: Two times a day (BID) | ORAL | 0 refills | Status: DC
Start: 2023-02-07 — End: 2023-07-30

## 2023-02-07 NOTE — Assessment & Plan Note (Signed)
Acute POC u/a does show signs of UTI Send out for culture and sensitivities treat with course of bactrim as she appears to have failed Macrobid. Further recommendations may be made based upon the results. Patient warned to go to ER if she experiences blistering rash, high fever, nausea, vomiting. She reports understanding.

## 2023-02-07 NOTE — Progress Notes (Addendum)
Established Patient Office Visit  Subjective   Patient ID: Sara Vance, female    DOB: 1975/10/23  Age: 47 y.o. MRN: [id number]  Chief Complaint  Patient presents with   Urinary Tract Infection    Just got off UTI last Sunday, feels like it has started again, Pressure and hurt when urinating, also spot of blood      Patient has for acute visit today for the above.  Reports symptoms originally started about 1 week ago.  She did go to urgent care and was prescribed Macrobid.  Unfortunately I do not have access to these records.  She reports that she started to feel better but then after completing the Macrobid symptoms returned.  She does not have nausea or vomiting, but does have dysuria and reports that she just started her menstrual cycle so she is seeing blood.    ROS: see HPI    Objective:     BP 94/60   Pulse 63   Temp 97.6 F (36.4 C) (Temporal)   Ht 5\' 3"  (1.6 m)   Wt 159 lb (72.1 kg)   LMP 02/06/2023 (Approximate)   SpO2 98%   BMI 28.17 kg/m    Physical Exam Vitals reviewed.  Constitutional:      General: She is not in acute distress.    Appearance: Normal appearance.  HENT:     Head: Normocephalic and atraumatic.  Neck:     Vascular: No carotid bruit.  Cardiovascular:     Rate and Rhythm: Normal rate and regular rhythm.     Pulses: Normal pulses.     Heart sounds: Normal heart sounds.  Pulmonary:     Effort: Pulmonary effort is normal.     Breath sounds: Normal breath sounds.  Skin:    General: Skin is warm and dry.  Neurological:     General: No focal deficit present.     Mental Status: She is alert and oriented to person, place, and time.  Psychiatric:        Mood and Affect: Mood normal.        Behavior: Behavior normal.        Judgment: Judgment normal.      Results for orders placed or performed in visit on 02/07/23  POCT Urinalysis Dipstick (Automated)  Result Value Ref Range   Color, UA     Clarity, UA     Glucose, UA Negative  Negative   Bilirubin, UA negative    Ketones, UA negative    Spec Grav, UA 1.015 1.010 - 1.025   Blood, UA positive    pH, UA 6.0 5.0 - 8.0   Protein, UA Negative Negative   Urobilinogen, UA 0.2 0.2 or 1.0 E.U./dL   Nitrite, UA negative    Leukocytes, UA Moderate (2+) (A) Negative  POCT urine pregnancy  Result Value Ref Range   Preg Test, Ur Negative Negative      The 10-year ASCVD risk score (Arnett DK, et al., 2019) is: 0.4%    Assessment & Plan:   Problem List Items Addressed This Visit       Other   Dysuria - Primary    Acute POC u/a does show signs of UTI Send out for culture and sensitivities treat with course of bactrim as she appears to have failed Macrobid. Further recommendations may be made based upon the results. Patient warned to go to ER if she experiences blistering rash, high fever, nausea, vomiting. She reports understanding.  Relevant Medications   sulfamethoxazole-trimethoprim (BACTRIM DS) 800-160 MG tablet   Other Relevant Orders   POCT Urinalysis Dipstick (Automated) (Completed)   Urinalysis with Culture, if indicated   Urine Culture   POCT urine pregnancy (Completed)    Return if symptoms worsen or fail to improve.    Elenore Paddy, NP

## 2023-02-07 NOTE — Addendum Note (Signed)
Addended by: Jiles Prows E on: 02/07/2023 02:51 PM   Modules accepted: Orders

## 2023-02-08 ENCOUNTER — Telehealth: Payer: Self-pay | Admitting: Internal Medicine

## 2023-02-08 ENCOUNTER — Other Ambulatory Visit: Payer: Medicare Other

## 2023-02-08 DIAGNOSIS — R300 Dysuria: Secondary | ICD-10-CM

## 2023-02-08 LAB — URINALYSIS WITH CULTURE, IF INDICATED
Bacteria, UA: NONE SEEN
Bilirubin Urine: NEGATIVE
Ketones, ur: NEGATIVE
Nitrite: NEGATIVE
Specific Gravity, Urine: 1.01 (ref 1.000–1.030)
Total Protein, Urine: NEGATIVE
Urine Glucose: NEGATIVE
Urobilinogen, UA: 0.2 (ref 0.0–1.0)
pH: 7 (ref 5.0–8.0)

## 2023-02-08 LAB — UNLABELED: Test Ordered On Req: 395

## 2023-02-08 NOTE — Telephone Encounter (Signed)
Pt called stating that the antibiotic that was given for her UTI have made her get a rash on the front part of her thigh. Pt inquiring about getting prescribed something else. Please advise.

## 2023-02-09 LAB — URINE CULTURE
MICRO NUMBER:: 15584636
Result:: NO GROWTH
SPECIMEN QUALITY:: ADEQUATE

## 2023-02-12 NOTE — Telephone Encounter (Signed)
Spoke to patient and she states that she is taking  Claritin and doing much better.

## 2023-02-27 DIAGNOSIS — F251 Schizoaffective disorder, depressive type: Secondary | ICD-10-CM | POA: Diagnosis not present

## 2023-02-27 DIAGNOSIS — F900 Attention-deficit hyperactivity disorder, predominantly inattentive type: Secondary | ICD-10-CM | POA: Diagnosis not present

## 2023-03-04 DIAGNOSIS — F251 Schizoaffective disorder, depressive type: Secondary | ICD-10-CM | POA: Diagnosis not present

## 2023-03-04 DIAGNOSIS — F900 Attention-deficit hyperactivity disorder, predominantly inattentive type: Secondary | ICD-10-CM | POA: Diagnosis not present

## 2023-03-07 ENCOUNTER — Encounter: Payer: Self-pay | Admitting: Family Medicine

## 2023-03-07 ENCOUNTER — Ambulatory Visit: Payer: Medicare Other | Admitting: Family Medicine

## 2023-03-07 VITALS — BP 94/60 | Ht 63.0 in | Wt 160.0 lb

## 2023-03-07 DIAGNOSIS — Z0000 Encounter for general adult medical examination without abnormal findings: Secondary | ICD-10-CM | POA: Diagnosis not present
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
## 2023-03-07 NOTE — Patient Instructions (Signed)
I really enjoyed getting to talk with you today! I am available on Tuesdays and Thursdays for virtual visits if you have any questions or concerns, or if I can be of any further assistance.   CHECKLIST FROM ANNUAL WELLNESS VISIT:  -Follow up (please call to schedule if not scheduled after visit):   -yearly for annual wellness visit with primary care office  Here is a list of your preventive care/health maintenance measures and the plan for each if any are due:  PLAN For any measures below that may be due:  -can get the updated covid vaccine 3 months out from the covid infection  Health Maintenance  Topic Date Due   COVID-19 Vaccine (3 - Moderna risk series) 05/28/2020   Medicare Annual Wellness (AWV)  03/06/2024   MAMMOGRAM  07/24/2024   Cervical Cancer Screening (HPV/Pap Cotest)  09/19/2027   DTaP/Tdap/Td (2 - Td or Tdap) 01/18/2030   Colonoscopy  01/24/2031   INFLUENZA VACCINE  Completed   Hepatitis C Screening  Completed   HIV Screening  Completed   HPV VACCINES  Aged Out    -See a dentist at least yearly  -Get your eyes checked and then per your eye specialist's recommendations  -Other issues addressed today:   -I have included below further information regarding a healthy whole foods based diet, physical activity guidelines for adults, stress management and opportunities for social connections. I hope you find this information useful.   -----------------------------------------------------------------------------------------------------------------------------------------------------------------------------------------------------------------------------------------------------------  NUTRITION: -eat real food: lots of colorful vegetables (half the plate) and fruits -5-7 servings of vegetables and fruits per day (fresh or steamed is best), exp. 2 servings of vegetables with lunch and dinner and 2 servings of fruit per day. Berries and greens such as kale and collards are  great choices.  -consume on a regular basis: whole grains (make sure first ingredient on label contains the word "whole"), fresh fruits, fish, nuts, seeds, healthy oils (such as olive oil, avocado oil, grape seed oil) -may eat small amounts of dairy and lean meat on occasion, but avoid processed meats such as ham, bacon, lunch meat, etc. -drink water -try to avoid fast food and pre-packaged foods, processed meat -most experts advise limiting sodium to < 2300mg  per day, should limit further is any chronic conditions such as high blood pressure, heart disease, diabetes, etc. The American Heart Association advised that < 1500mg  is is ideal -try to avoid foods that contain any ingredients with names you do not recognize  -try to avoid sugar/sweets (except for the natural sugar that occurs in fresh fruit) -try to avoid sweet drinks -try to avoid white rice, white bread, pasta (unless whole grain), white or yellow potatoes  EXERCISE GUIDELINES FOR ADULTS: -if you wish to increase your physical activity, do so gradually and with the approval of your doctor -STOP and seek medical care immediately if you have any chest pain, chest discomfort or trouble breathing when starting or increasing exercise  -move and stretch your body, legs, feet and arms when sitting for long periods -Physical activity guidelines for optimal health in adults: -least 150 minutes per week of aerobic exercise (can talk, but not sing) once approved by your doctor, 20-30 minutes of sustained activity or two 10 minute episodes of sustained activity every day.  -resistance training at least 2 days per week if approved by your doctor -balance exercises 3+ days per week:   Stand somewhere where you have something sturdy to hold onto if you lose balance.  1) lift up on toes, start with 5x per day and work up to 20x   2) stand and lift on leg straight out to the side so that foot is a few inches of the floor, start with 5x each side and  work up to 20x each side   3) stand on one foot, start with 5 seconds each side and work up to 20 seconds on each side  If you need ideas or help with getting more active:  -Silver sneakers https://tools.silversneakers.com  -Walk with a Doc: http://www.duncan-williams.com/  -try to include resistance (weight lifting/strength building) and balance exercises twice per week: or the following link for ideas: http://castillo-powell.com/  BuyDucts.dk  STRESS MANAGEMENT: -can try meditating, or just sitting quietly with deep breathing while intentionally relaxing all parts of your body for 5 minutes daily -if you need further help with stress, anxiety or depression please follow up with your primary doctor or contact the wonderful folks at [facility]: [phone]  SOCIAL CONNECTIONS: -options in [place] if you wish to engage in more social and exercise related activities:  -Silver sneakers https://tools.silversneakers.com  -Walk with a Doc: http://www.duncan-williams.com/  -Check out the [facility] Active Adults 50+ section on the [place] [employer] (hiking clubs, book clubs, cards and games, chess, exercise classes, aquatic classes and much more) - see the website for details: https://www.[place].gov/departments/parks-recreation/active-adults50  -YouTube has lots of exercise videos for different ages and abilities as well  -[facility] (a variety of indoor and outdoor inperson activities for adults). [phone]. [address].  -Virtual Online Classes (a variety of topics): see seniorplanet.org or call [phone]  -consider volunteering at a school, hospice center, church, senior center or elsewhere         ADVANCED HEALTHCARE DIRECTIVES:  [place] Advanced Directives  assistance:   ExpressWeek.com.cy  Everyone should have advanced health care directives in place. This is so that you get the care you want, should you ever be in a situation where you are unable to make your own medical decisions.   From the [place] Advanced Directive Website: "Advance Health Care Directives are legal documents in which you give written instructions about your health care if, in the future, you cannot speak for yourself.   A health care power of attorney allows you to name a person you trust to make your health care decisions if you cannot make them yourself. A declaration of a desire for a natural death (or living will) is document, which states that you desire not to have your life prolonged by extraordinary measures if you have a terminal or incurable illness or if you are in a vegetative state. An advance instruction for mental health treatment makes a declaration of instructions, information and preferences regarding your mental health treatment. It also states that you are aware that the advance instruction authorizes a mental health treatment provider to act according to your wishes. It may also outline your consent or refusal of mental health treatment. A declaration of an anatomical gift allows anyone over the age of 34 to make a gift by will, organ donor card or other document."   Please see the following website or an elder law attorney for forms, FAQs and for completion of advanced directives: [place] Health Care Directives Advance Health Care Directives (http://guzman.com/)  Or copy and paste the following to your web browser: PoshChat.fi

## 2023-04-12 DIAGNOSIS — F251 Schizoaffective disorder, depressive type: Secondary | ICD-10-CM | POA: Diagnosis not present

## 2023-04-12 DIAGNOSIS — F900 Attention-deficit hyperactivity disorder, predominantly inattentive type: Secondary | ICD-10-CM | POA: Diagnosis not present

## 2023-05-22 DIAGNOSIS — F900 Attention-deficit hyperactivity disorder, predominantly inattentive type: Secondary | ICD-10-CM | POA: Diagnosis not present

## 2023-05-22 DIAGNOSIS — F251 Schizoaffective disorder, depressive type: Secondary | ICD-10-CM | POA: Diagnosis not present

## 2023-05-29 DIAGNOSIS — F3181 Bipolar II disorder: Secondary | ICD-10-CM | POA: Diagnosis not present

## 2023-05-29 DIAGNOSIS — F902 Attention-deficit hyperactivity disorder, combined type: Secondary | ICD-10-CM | POA: Diagnosis not present

## 2023-06-07 DIAGNOSIS — F902 Attention-deficit hyperactivity disorder, combined type: Secondary | ICD-10-CM | POA: Diagnosis not present

## 2023-06-07 DIAGNOSIS — F251 Schizoaffective disorder, depressive type: Secondary | ICD-10-CM | POA: Diagnosis not present

## 2023-07-04 DIAGNOSIS — F251 Schizoaffective disorder, depressive type: Secondary | ICD-10-CM | POA: Diagnosis not present

## 2023-07-04 DIAGNOSIS — F900 Attention-deficit hyperactivity disorder, predominantly inattentive type: Secondary | ICD-10-CM | POA: Diagnosis not present

## 2023-07-30 ENCOUNTER — Ambulatory Visit: Admitting: Orthopedic Surgery

## 2023-07-30 ENCOUNTER — Encounter: Payer: Self-pay | Admitting: Orthopedic Surgery

## 2023-07-30 ENCOUNTER — Other Ambulatory Visit (INDEPENDENT_AMBULATORY_CARE_PROVIDER_SITE_OTHER): Payer: Self-pay

## 2023-07-30 VITALS — BP 111/73 | HR 64 | Ht 63.0 in | Wt 168.0 lb

## 2023-07-30 DIAGNOSIS — M25561 Pain in right knee: Secondary | ICD-10-CM | POA: Diagnosis not present

## 2023-07-30 DIAGNOSIS — M7121 Synovial cyst of popliteal space [Baker], right knee: Secondary | ICD-10-CM | POA: Diagnosis not present

## 2023-07-30 NOTE — Patient Instructions (Signed)
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
## 2023-08-01 DIAGNOSIS — F900 Attention-deficit hyperactivity disorder, predominantly inattentive type: Secondary | ICD-10-CM | POA: Diagnosis not present

## 2023-08-01 DIAGNOSIS — F251 Schizoaffective disorder, depressive type: Secondary | ICD-10-CM | POA: Diagnosis not present

## 2023-08-22 DIAGNOSIS — F251 Schizoaffective disorder, depressive type: Secondary | ICD-10-CM | POA: Diagnosis not present

## 2023-08-30 DIAGNOSIS — F251 Schizoaffective disorder, depressive type: Secondary | ICD-10-CM | POA: Diagnosis not present

## 2023-10-02 DIAGNOSIS — F251 Schizoaffective disorder, depressive type: Secondary | ICD-10-CM | POA: Diagnosis not present

## 2023-11-08 DIAGNOSIS — F251 Schizoaffective disorder, depressive type: Secondary | ICD-10-CM | POA: Diagnosis not present

## 2023-11-13 ENCOUNTER — Encounter: Payer: Self-pay | Admitting: Obstetrics & Gynecology

## 2023-11-13 ENCOUNTER — Ambulatory Visit: Admitting: Adult Health

## 2023-11-13 VITALS — BP 117/80 | HR 73 | Ht 63.0 in | Wt 157.6 lb

## 2023-11-13 DIAGNOSIS — N3946 Mixed incontinence: Secondary | ICD-10-CM | POA: Diagnosis not present

## 2023-11-13 MED ORDER — MIRABEGRON ER 25 MG PO TB24: 25.0000 mg | ORAL_TABLET | Freq: Every day | ORAL | 11 refills | Status: AC
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
## 2023-11-14 ENCOUNTER — Ambulatory Visit: Payer: Self-pay | Admitting: Obstetrics & Gynecology

## 2023-11-14 ENCOUNTER — Telehealth: Payer: Self-pay | Admitting: Obstetrics & Gynecology

## 2023-11-14 LAB — URINALYSIS, ROUTINE W REFLEX MICROSCOPIC
Bilirubin, UA: NEGATIVE
Glucose, UA: NEGATIVE
Ketones, UA: NEGATIVE
Leukocytes,UA: NEGATIVE
Nitrite, UA: NEGATIVE
Protein,UA: NEGATIVE
RBC, UA: NEGATIVE
Specific Gravity, UA: 1.006 (ref 1.005–1.030)
Urobilinogen, Ur: 0.2 mg/dL (ref 0.2–1.0)
pH, UA: 6.5 (ref 5.0–7.5)

## 2023-11-14 NOTE — Telephone Encounter (Signed)
Patient calling about lab results. Please advise.

## 2023-11-15 LAB — URINE CULTURE

## 2023-11-21 DIAGNOSIS — F251 Schizoaffective disorder, depressive type: Secondary | ICD-10-CM | POA: Diagnosis not present

## 2023-11-21 DIAGNOSIS — F900 Attention-deficit hyperactivity disorder, predominantly inattentive type: Secondary | ICD-10-CM | POA: Diagnosis not present

## 2023-12-16 DIAGNOSIS — F900 Attention-deficit hyperactivity disorder, predominantly inattentive type: Secondary | ICD-10-CM | POA: Diagnosis not present

## 2023-12-16 DIAGNOSIS — F251 Schizoaffective disorder, depressive type: Secondary | ICD-10-CM | POA: Diagnosis not present

## 2023-12-20 ENCOUNTER — Encounter: Payer: Self-pay | Admitting: Radiology

## 2024-01-06 DIAGNOSIS — F250 Schizoaffective disorder, bipolar type: Secondary | ICD-10-CM | POA: Diagnosis not present

## 2024-01-27 DIAGNOSIS — F900 Attention-deficit hyperactivity disorder, predominantly inattentive type: Secondary | ICD-10-CM | POA: Diagnosis not present

## 2024-01-27 DIAGNOSIS — F251 Schizoaffective disorder, depressive type: Secondary | ICD-10-CM | POA: Diagnosis not present

## 2024-02-20 ENCOUNTER — Ambulatory Visit: Admitting: Family Medicine

## 2024-02-20 DIAGNOSIS — Z0000 Encounter for general adult medical examination without abnormal findings: Secondary | ICD-10-CM

## 2024-02-20 DIAGNOSIS — Z1231 Encounter for screening mammogram for malignant neoplasm of breast: Secondary | ICD-10-CM | POA: Diagnosis not present
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
## 2024-02-20 NOTE — Patient Instructions (Signed)
 I really enjoyed getting to talk with you today! I am available on Tuesdays and Thursdays for virtual visits if you have any questions or concerns, or if I can be of any further assistance.   CHECKLIST FROM ANNUAL WELLNESS VISIT:  -Follow up (please call to schedule if not scheduled after visit):   -yearly for annual wellness visit with primary care office  Here is a list of your preventive care/health maintenance measures and the plan for each if any are due:  PLAN For any measures below that may be due:    1. Can get vaccines at the pharmacy - please let us  know when you do so that we can update your records.   2. Please schedule mammogram: [phone]  Health Maintenance  Topic Date Due   COVID-19 Vaccine (3 - Moderna risk series) 05/28/2020   Influenza Vaccine  11/29/2023   Pneumococcal Vaccine: 50+ Years (1 of 1 - PCV) Never done   Medicare Annual Wellness (AWV)  03/06/2024   Zoster Vaccines- Shingrix (1 of 2) 05/22/2024 (Originally 01/01/1995)   Mammogram  07/24/2024   Cervical Cancer Screening (HPV/Pap Cotest)  09/19/2027   DTaP/Tdap/Td (2 - Td or Tdap) 01/18/2030   Colonoscopy  01/24/2031   Hepatitis B Vaccines 19-59 Average Risk  Completed   Hepatitis C Screening  Completed   HIV Screening  Completed   HPV VACCINES  Aged Out   Meningococcal B Vaccine  Aged Out    -See a dentist at least yearly  -Get your eyes checked and then per your eye specialist's recommendations  -Other issues addressed today:   -I have included below further information regarding a healthy whole foods based diet, physical activity guidelines for adults, stress management and opportunities for social connections. I hope you find this information useful.    -----------------------------------------------------------------------------------------------------------------------------------------------------------------------------------------------------------------------------------------------------------    NUTRITION: -eat real food: lots of colorful vegetables (half the plate) and fruits -5-7 servings of vegetables and fruits per day (fresh or steamed is best), exp. 2 servings of vegetables with lunch and dinner and 2 servings of fruit per day. Berries and greens such as kale and collards are great choices.  -consume on a regular basis:  fresh fruits, fresh veggies, fish, nuts, seeds, healthy oils (such as olive oil, avocado oil), whole grains (make sure for bread/pasta/crackers/etc., that the first ingredient on label contains the word whole), legumes. -can eat small amounts of dairy and lean meat (no larger than the palm of your hand), but avoid processed meats such as ham, bacon, lunch meat, etc. -drink water  -try to avoid fast food and pre-packaged foods, processed meat, ultra processed foods/beverages (donuts, candy, etc.) -most experts advise limiting sodium to < 2300mg  per day, should limit further is any chronic conditions such as high blood pressure, heart disease, diabetes, etc. The American Heart Association advised that < 1500mg  is is ideal -try to avoid foods/beverages that contain any ingredients with names you do not recognize  -try to avoid foods/beverages  with added sugar or sweeteners/sweets  -try to avoid sweet drinks (including diet drinks): soda, juice, Gatorade, sweet tea, power drinks, diet drinks -try to avoid white rice, white bread, pasta (unless whole grain)  EXERCISE GUIDELINES FOR ADULTS: -if you wish to increase your physical activity, do so gradually and with the approval of your doctor -STOP and seek medical care immediately if you have any chest pain, chest discomfort or trouble breathing when starting or  increasing exercise  -move and stretch your body, legs, feet and arms  when sitting for long periods -Physical activity guidelines for optimal health in adults: -get at least 150 minutes per week of moderate exercise (can talk, but not sing); this is about 20-30 minutes of sustained activity 5-7 days per week or two 10-15 minute episodes of sustained activity 5-7 days per week -do some muscle building/resistance training/strength training at least 2 days per week  -balance exercises 3+ days per week:   Stand somewhere where you have something sturdy to hold onto if you lose balance    1) lift up on toes, then back down, start with 5x per day and work up to 20x   2) stand and lift one leg straight out to the side so that foot is a few inches of the floor, start with 5x each side and work up to 20x each side   3) stand on one foot, start with 5 seconds each side and work up to 20 seconds on each side  If you need ideas or help with getting more active:  -Silver sneakers https://tools.silversneakers.com  -Walk with a Doc: http://www.duncan-williams.com/  -try to include resistance (weight lifting/strength building) and balance exercises twice per week: or the following link for ideas: http://castillo-powell.com/  BuyDucts.dk  STRESS MANAGEMENT: -can try meditating, or just sitting quietly with deep breathing while intentionally relaxing all parts of your body for 5 minutes daily -if you need further help with stress, anxiety or depression please follow up with your primary doctor or contact the wonderful folks at [facility]: [phone]  SOCIAL CONNECTIONS: -options in [place] if you wish to engage in more social and exercise related activities:  -Silver sneakers https://tools.silversneakers.com  -Walk with a Doc: http://www.duncan-williams.[email or web address]/  -Check out the [facility] Active Adults 50+  section on the [place] [employer] (hiking clubs, book clubs, cards and games, chess, exercise classes, aquatic classes and much more) - see the website for details: https://www.[place].gov/departments/parks-recreation/active-adults50  -YouTube has lots of exercise videos for different ages and abilities as well  -[facility] (a variety of indoor and outdoor inperson activities for adults). [phone]. [address]  -Virtual Online Classes (a variety of topics): see seniorplanet.org or call [phone]  -consider volunteering at a school, hospice center, church, senior center or elsewhere          ADVANCED HEALTHCARE DIRECTIVES:  [place] Advanced Directives assistance:   ExpressWeek.com.cy  Everyone should have advanced health care directives in place. This is so that you get the care you want, should you ever be in a situation where you are unable to make your own medical decisions.   From the [place] Advanced Directive Website: Advance Health Care Directives are legal documents in which you give written instructions about your health care if, in the future, you cannot speak for yourself.   A health care power of attorney allows you to name a person you trust to make your health care decisions if you cannot make them yourself. A declaration of a desire for a natural death (or living will) is document, which states that you desire not to have your life prolonged by extraordinary measures if you have a terminal or incurable illness or if you are in a vegetative state. An advance instruction for mental health treatment makes a declaration of instructions, information and preferences regarding your mental health treatment. It also states that you are aware that the advance instruction authorizes a mental health treatment provider to act according to your wishes. It may also outline your consent or  refusal of mental health treatment. A declaration of an anatomical gift allows anyone over the age of 9 to make a gift by will, organ donor card or other document.   Please see the following website or an elder law attorney for forms, FAQs and for completion of advanced directives: [place]  Print production planner Health Care Directives Advance Health Care Directives ([email or web address]/)  Or copy and paste the following to your web browser: [email or web address]

## 2024-02-21 DIAGNOSIS — F900 Attention-deficit hyperactivity disorder, predominantly inattentive type: Secondary | ICD-10-CM | POA: Diagnosis not present

## 2024-02-21 DIAGNOSIS — F251 Schizoaffective disorder, depressive type: Secondary | ICD-10-CM | POA: Diagnosis not present

## 2024-03-02 ENCOUNTER — Encounter: Payer: Self-pay | Admitting: Radiology

## 2024-03-02 ENCOUNTER — Ambulatory Visit: Admitting: Obstetrics and Gynecology

## 2024-03-13 ENCOUNTER — Ambulatory Visit

## 2024-03-17 ENCOUNTER — Encounter: Admitting: Internal Medicine

## 2024-03-19 DIAGNOSIS — F251 Schizoaffective disorder, depressive type: Secondary | ICD-10-CM | POA: Diagnosis not present

## 2024-04-01 DIAGNOSIS — F251 Schizoaffective disorder, depressive type: Secondary | ICD-10-CM | POA: Diagnosis not present

## 2024-04-06 ENCOUNTER — Ambulatory Visit

## 2024-05-01 ENCOUNTER — Ambulatory Visit
Admission: RE | Admit: 2024-05-01 | Discharge: 2024-05-01 | Disposition: A | Discharge status: Home / Self Care | Source: Ambulatory Visit | Attending: Family Medicine | Admitting: Family Medicine

## 2024-05-01 DIAGNOSIS — Z1231 Encounter for screening mammogram for malignant neoplasm of breast: Secondary | ICD-10-CM

## 2024-05-20 ENCOUNTER — Ambulatory Visit: Admitting: Obstetrics and Gynecology

## 2024-05-25 ENCOUNTER — Ambulatory Visit: Admitting: Obstetrics and Gynecology

## 2024-07-16 ENCOUNTER — Ambulatory Visit: Admitting: Obstetrics
